# Patient Record
Sex: Male | Born: 1955 | Race: White | Hispanic: No | Marital: Married | Smoking: Current every day smoker
Health system: Southern US, Community
[De-identification: ages and names within clinical notes are randomized; demographics above are authoritative.]

## PROBLEM LIST (undated history)

## (undated) DIAGNOSIS — I10 Essential (primary) hypertension: Secondary | ICD-10-CM

## (undated) DIAGNOSIS — C159 Malignant neoplasm of esophagus, unspecified: Secondary | ICD-10-CM

## (undated) DIAGNOSIS — E039 Hypothyroidism, unspecified: Secondary | ICD-10-CM

## (undated) DIAGNOSIS — G4730 Sleep apnea, unspecified: Secondary | ICD-10-CM

## (undated) DIAGNOSIS — R0981 Nasal congestion: Secondary | ICD-10-CM

## (undated) DIAGNOSIS — J302 Other seasonal allergic rhinitis: Secondary | ICD-10-CM

## (undated) DIAGNOSIS — Z969 Presence of functional implant, unspecified: Secondary | ICD-10-CM

## (undated) DIAGNOSIS — R05 Cough: Secondary | ICD-10-CM

## (undated) DIAGNOSIS — Z8614 Personal history of Methicillin resistant Staphylococcus aureus infection: Secondary | ICD-10-CM

## (undated) DIAGNOSIS — M1990 Unspecified osteoarthritis, unspecified site: Secondary | ICD-10-CM

## (undated) DIAGNOSIS — K219 Gastro-esophageal reflux disease without esophagitis: Secondary | ICD-10-CM

## (undated) DIAGNOSIS — R7611 Nonspecific reaction to tuberculin skin test without active tuberculosis: Secondary | ICD-10-CM

## (undated) HISTORY — DX: Malignant neoplasm of esophagus, unspecified: C15.9

## (undated) HISTORY — PX: KNEE ARTHROPLASTY: SHX992

## (undated) HISTORY — PX: CATARACT EXTRACTION: SUR2

## (undated) HISTORY — PX: OTHER SURGICAL HISTORY: SHX169

## (undated) HISTORY — PX: FINGER AMPUTATION: SHX636

---

## 2011-03-14 ENCOUNTER — Encounter (HOSPITAL_BASED_OUTPATIENT_CLINIC_OR_DEPARTMENT_OTHER)
Admission: RE | Admit: 2011-03-14 | Discharge: 2011-03-14 | Disposition: A | Discharge status: Home / Self Care | Payer: Worker's Compensation | Source: Ambulatory Visit | Attending: Orthopedic Surgery | Admitting: Orthopedic Surgery

## 2011-03-14 DIAGNOSIS — Z01812 Encounter for preprocedural laboratory examination: Secondary | ICD-10-CM | POA: Insufficient documentation

## 2011-03-14 LAB — BASIC METABOLIC PANEL
Calcium: 9 mg/dL (ref 8.4–10.5)
GFR calc Af Amer: >60 mL/min (ref >60)
GFR calc non Af Amer: >60 mL/min (ref >60)
Glucose, Bld: 132 mg/dL — ABNORMAL HIGH (ref 70–99)
Potassium: 3.9 mEq/L (ref 3.5–5.1)
Sodium: 139 mEq/L (ref 135–145)

## 2011-03-18 ENCOUNTER — Ambulatory Visit (HOSPITAL_BASED_OUTPATIENT_CLINIC_OR_DEPARTMENT_OTHER)
Admission: RE | Admit: 2011-03-18 | Discharge: 2011-03-18 | Disposition: A | Discharge status: Home / Self Care | Payer: Worker's Compensation | Source: Ambulatory Visit | Attending: Orthopedic Surgery | Admitting: Orthopedic Surgery

## 2011-03-18 DIAGNOSIS — E039 Hypothyroidism, unspecified: Secondary | ICD-10-CM | POA: Insufficient documentation

## 2011-03-18 DIAGNOSIS — M129 Arthropathy, unspecified: Secondary | ICD-10-CM | POA: Insufficient documentation

## 2011-03-18 DIAGNOSIS — M9380 Other specified osteochondropathies of unspecified site: Secondary | ICD-10-CM | POA: Insufficient documentation

## 2011-03-18 DIAGNOSIS — F17200 Nicotine dependence, unspecified, uncomplicated: Secondary | ICD-10-CM | POA: Insufficient documentation

## 2011-03-18 DIAGNOSIS — Z01812 Encounter for preprocedural laboratory examination: Secondary | ICD-10-CM | POA: Insufficient documentation

## 2011-03-18 DIAGNOSIS — I10 Essential (primary) hypertension: Secondary | ICD-10-CM | POA: Insufficient documentation

## 2011-03-18 HISTORY — PX: WRIST FUSION: SHX839

## 2011-03-18 LAB — POCT HEMOGLOBIN-HEMACUE: Hemoglobin: 16 g/dL (ref 13.0–17.0)

## 2011-03-21 NOTE — Op Note (Signed)
NAMEODA, LANSDOWNE               ACCOUNT NO.:  [id number]  MEDICAL RECORD NO.:  [id number]           PATIENT TYPE:  LOCATION:                                 FACILITY:  PHYSICIAN:  Betha Loa, MD             DATE OF BIRTH:  DATE OF PROCEDURE:  03/18/2011 DATE OF DISCHARGE:                              OPERATIVE REPORT   PREOPERATIVE DIAGNOSIS:  Left wrist Kienbock disease.  POSTOPERATIVE DIAGNOSIS:  Left wrist Kienbock disease.  PROCEDURE:  Left wrist scaphotrapeziotrapezoid fusion.  SURGEON:  Betha Loa, MD  ASSISTANT:  Cindee Salt, MD  ANESTHESIA:  General with regional.  IV FLUIDS:  Per Anesthesia flow sheet.  ESTIMATED BLOOD LOSS:  Minimal.  COMPLICATIONS:  None.  SPECIMENS:  None.  TOURNIQUET TIME:  130 minutes.  DISPOSITION:  Stable to PACU.  INDICATIONS:  Mr. Lamantia is a 55 year old white male who was at work when a door closed on his left wrist.  He had pain in the wrist and was evaluated at an outside hospital and felt to have a lunate fracture.  He was referred to me for further care.  On evaluation, he had intact sensation and capillary refill in the fingertips.  He had a hematoma on the dorsum of the hand.  He had tenderness to palpation at the wrist. Review of x-rays, CT, and MRI revealed that this was likely a Kienbock disease exacerbated by his injury.  I discussed with Mr. Hodapp the nature of this condition.  I recommended a STT fusion with possible radial styloidectomy for treatment of the lunate collapse.  Risks, benefits, and alternative of surgery were discussed including risk of blood loss, infection, damage to nerves, vessels, tendons, ligaments, and bone, failure of surgery, need for additional surgery, complications with wound healing, continued pain, nonunion, malunion, stiffness, potential need for further surgeries in future including possible proximal row carpectomy versus total wrist fusion.  He voiced understanding these risks and  elected to proceed.  OPERATIVE COURSE:  After being identified preoperatively by myself, the patient agreed upon procedure and site of procedure.  The surgical site was marked.  Risks, benefits, and alternative of surgery were reviewed and he wished to proceed.  Surgical consent had been signed.  He was given 1 g of IV Ancef as preoperative antibiotic prophylaxis.  Her was transported to the operating room and placed on the operating table in supine position with the left upper extremity on arm board.  General anesthesia was induced by the anesthesiologist.  A regional block had been performed by the anesthesiologist in preoperative holding.  The left upper extremity was prepped and draped in the normal sterile orthopedic fashion.  A surgical pause was performed between surgeons, Anesthesia, operating room staff and all were in agreement as to the patient, procedure, and site of procedure.  Tourniquet on the proximal aspect of the extremity was inflated to 250 mmHg after exsanguination of the limb with an Esmarch bandage.  An incision was made at the volar radial surface of the wrist.  The FCR was identified and sheath incised. It was retracted  ulnarly.  The radial artery and superficial palmar branch were identified and protected throughout the case.  They were retracted radially.  The radioscaphoid articulation and STT joint were identified.  The radioscaphocapitate ligament was identified and protected throughout the case.  The STT joint was entered.  There were some degenerative changes.  The distal pole of scaphoid the proximal trapezium and trapezoid were cleared of cartilage using the rongeurs and curettes.  A bur was then used to break through the subchondral bone. An incision was made in the pronator quadratus after retracting the FCR and the FPL tendons ulnarly.  The volar cortex of the distal radius was breached using a 0.045-inch K-wires drilled in a circle and an osteotome.   Bone graft was taken using the curettes.  The wounds were all copiously irrigated with sterile saline.  The bone graft was placed in the STT joint.  The guidewires for the Mini Acutrak screws were then placed, one going from the distal pole of the scaphoid into the trapezium and one  going from distal pole of the scaphoid into the trapezoid.  This was checked under C-arm guidance and position was felt to be appropriate.  The opening reamer was then used and the screws placed after standard measuring technique.  Good compression was obtained.  An 18-mm and 22-mm screw were used.  The remainder of the bone graft was packed into the STT joint.  A 3-0 Vicryl was then used to close the capsule over top of the STT joint.  The pronator quadratus was then closed using the Vicryl as well.  A single inverted interrupted stitch was placed in the skin and the skin was closed with 4-0 nylon in a horizontal mattress fashion.  The wound was dressed with sterile Xeroform and 4x4s and wrapped with Kerlix bandage.  A volar splint and thumb spica splint were placed and wrapped with Kerlix and Ace bandage. The tourniquet was deflated at 130 minutes.  The fingertips were pink with brisk capillary refill after deflation of the tourniquet. Operative drapes were broken down.  The patient was awoken from anesthesia safely.  He was transferred back to stretcher and taken to PACU in stable condition.  I will see him back in 1 week for postoperative followup.  I will give him Norco 7.5/325 one to two p.o. q.6 h p.r.n. pain, dispensed #50.     Betha Loa, MD     KK/MEDQ  D:  03/18/2011  T:  03/19/2011  Job:  [id number]  Electronically Signed by Betha Loa  on 03/21/2011 02:35:23 PM

## 2012-03-01 ENCOUNTER — Other Ambulatory Visit: Payer: Self-pay | Admitting: Orthopedic Surgery

## 2012-03-22 DIAGNOSIS — Z969 Presence of functional implant, unspecified: Secondary | ICD-10-CM

## 2012-03-22 HISTORY — DX: Presence of functional implant, unspecified: Z96.9

## 2012-03-25 ENCOUNTER — Encounter (HOSPITAL_BASED_OUTPATIENT_CLINIC_OR_DEPARTMENT_OTHER): Payer: Self-pay | Admitting: *Deleted

## 2012-03-25 DIAGNOSIS — R059 Cough, unspecified: Secondary | ICD-10-CM

## 2012-03-25 DIAGNOSIS — R0981 Nasal congestion: Secondary | ICD-10-CM

## 2012-03-25 HISTORY — DX: Cough, unspecified: R05.9

## 2012-03-25 HISTORY — DX: Nasal congestion: R09.81

## 2012-03-25 NOTE — Pre-Procedure Instructions (Signed)
Last EKG req. from [facility]

## 2012-03-25 NOTE — Pre-Procedure Instructions (Signed)
EKG from 01/2012 received

## 2012-03-26 ENCOUNTER — Encounter (HOSPITAL_BASED_OUTPATIENT_CLINIC_OR_DEPARTMENT_OTHER)
Admission: RE | Admit: 2012-03-26 | Discharge: 2012-03-26 | Disposition: A | Discharge status: Home / Self Care | Payer: Worker's Compensation | Source: Ambulatory Visit | Attending: Orthopedic Surgery | Admitting: Orthopedic Surgery

## 2012-03-26 LAB — BASIC METABOLIC PANEL
BUN: 11 mg/dL (ref 6–23)
Chloride: 105 mEq/L (ref 96–112)
Creatinine, Ser: 0.93 mg/dL (ref 0.50–1.35)
GFR calc Af Amer: >90 mL/min (ref >90)
GFR calc non Af Amer: >90 mL/min (ref >90)
Potassium: 5 mEq/L (ref 3.5–5.1)

## 2012-03-30 ENCOUNTER — Encounter (HOSPITAL_BASED_OUTPATIENT_CLINIC_OR_DEPARTMENT_OTHER): Payer: Self-pay | Admitting: Orthopedic Surgery

## 2012-03-30 ENCOUNTER — Encounter (HOSPITAL_BASED_OUTPATIENT_CLINIC_OR_DEPARTMENT_OTHER): Payer: Self-pay | Admitting: Anesthesiology

## 2012-03-30 ENCOUNTER — Ambulatory Visit (HOSPITAL_BASED_OUTPATIENT_CLINIC_OR_DEPARTMENT_OTHER)
Admission: RE | Admit: 2012-03-30 | Discharge: 2012-03-30 | Disposition: A | Discharge status: Home / Self Care | Payer: Worker's Compensation | Source: Ambulatory Visit | Attending: Orthopedic Surgery | Admitting: Orthopedic Surgery

## 2012-03-30 ENCOUNTER — Encounter (HOSPITAL_BASED_OUTPATIENT_CLINIC_OR_DEPARTMENT_OTHER)
Admission: RE | Disposition: A | Discharge status: Home / Self Care | Payer: Self-pay | Source: Ambulatory Visit | Attending: Orthopedic Surgery

## 2012-03-30 ENCOUNTER — Ambulatory Visit (HOSPITAL_BASED_OUTPATIENT_CLINIC_OR_DEPARTMENT_OTHER): Payer: Worker's Compensation | Admitting: Anesthesiology

## 2012-03-30 ENCOUNTER — Encounter (HOSPITAL_BASED_OUTPATIENT_CLINIC_OR_DEPARTMENT_OTHER): Payer: Self-pay | Admitting: *Deleted

## 2012-03-30 DIAGNOSIS — Z01812 Encounter for preprocedural laboratory examination: Secondary | ICD-10-CM | POA: Insufficient documentation

## 2012-03-30 DIAGNOSIS — M65839 Other synovitis and tenosynovitis, unspecified forearm: Secondary | ICD-10-CM | POA: Insufficient documentation

## 2012-03-30 DIAGNOSIS — IMO0002 Reserved for concepts with insufficient information to code with codable children: Secondary | ICD-10-CM | POA: Insufficient documentation

## 2012-03-30 DIAGNOSIS — I10 Essential (primary) hypertension: Secondary | ICD-10-CM | POA: Insufficient documentation

## 2012-03-30 HISTORY — DX: Cough: R05

## 2012-03-30 HISTORY — DX: Nasal congestion: R09.81

## 2012-03-30 HISTORY — DX: Presence of functional implant, unspecified: Z96.9

## 2012-03-30 HISTORY — DX: Hypothyroidism, unspecified: E03.9

## 2012-03-30 HISTORY — PX: HARDWARE REMOVAL: SHX979

## 2012-03-30 HISTORY — DX: Personal history of Methicillin resistant Staphylococcus aureus infection: Z86.14

## 2012-03-30 HISTORY — DX: Essential (primary) hypertension: I10

## 2012-03-30 HISTORY — DX: Unspecified osteoarthritis, unspecified site: M19.90

## 2012-03-30 HISTORY — DX: Other seasonal allergic rhinitis: J30.2

## 2012-03-30 SURGERY — REMOVAL, HARDWARE
Anesthesia: General | Site: Wrist | Laterality: Left | Wound class: Clean

## 2012-03-30 MED ORDER — MORPHINE SULFATE 4 MG/ML IJ SOLN: 0.0500 mg/kg | INTRAMUSCULAR | Status: DC | PRN

## 2012-03-30 MED ORDER — EPHEDRINE SULFATE 50 MG/ML IJ SOLN
INTRAMUSCULAR | Status: DC | PRN
  Administered 2012-03-30 (×2): 15 mg via INTRAVENOUS
  Administered 2012-03-30: 10 mg via INTRAVENOUS

## 2012-03-30 MED ORDER — LACTATED RINGERS IV SOLN: INTRAVENOUS | Status: DC

## 2012-03-30 MED ORDER — 0.9 % SODIUM CHLORIDE (POUR BTL) OPTIME
TOPICAL | Status: DC | PRN
  Administered 2012-03-30: 100 mL

## 2012-03-30 MED ORDER — LIDOCAINE HCL (CARDIAC) 20 MG/ML IV SOLN
INTRAVENOUS | Status: DC | PRN
  Administered 2012-03-30: 100 mg via INTRAVENOUS

## 2012-03-30 MED ORDER — LACTATED RINGERS IV SOLN
INTRAVENOUS | Status: DC
  Administered 2012-03-30 (×3): via INTRAVENOUS

## 2012-03-30 MED ORDER — CEFAZOLIN SODIUM-DEXTROSE 2-3 GM-% IV SOLR
2.0000 g | INTRAVENOUS | Status: AC
  Administered 2012-03-30: 2 g via INTRAVENOUS

## 2012-03-30 MED ORDER — ROPIVACAINE HCL 5 MG/ML IJ SOLN
INTRAMUSCULAR | Status: DC | PRN
  Administered 2012-03-30: 30 mL via EPIDURAL

## 2012-03-30 MED ORDER — HYDROCODONE-ACETAMINOPHEN 5-325 MG PO TABS: ORAL_TABLET | ORAL | Status: DC

## 2012-03-30 MED ORDER — METOCLOPRAMIDE HCL 5 MG/ML IJ SOLN: 10.0000 mg | Freq: Once | INTRAMUSCULAR | Status: DC | PRN

## 2012-03-30 MED ORDER — MIDAZOLAM HCL 2 MG/2ML IJ SOLN
0.5000 mg | INTRAMUSCULAR | Status: DC | PRN
  Administered 2012-03-30: 2 mg via INTRAVENOUS

## 2012-03-30 MED ORDER — ONDANSETRON HCL 4 MG/2ML IJ SOLN
INTRAMUSCULAR | Status: DC | PRN
  Administered 2012-03-30: 4 mg via INTRAVENOUS

## 2012-03-30 MED ORDER — CEFAZOLIN SODIUM 1-5 GM-% IV SOLN: 1.0000 g | INTRAVENOUS | Status: DC

## 2012-03-30 MED ORDER — PROPOFOL 10 MG/ML IV EMUL
INTRAVENOUS | Status: DC | PRN
  Administered 2012-03-30: 300 mg via INTRAVENOUS

## 2012-03-30 MED ORDER — FENTANYL CITRATE 0.05 MG/ML IJ SOLN: 25.0000 ug | INTRAMUSCULAR | Status: DC | PRN

## 2012-03-30 MED ORDER — DEXAMETHASONE SODIUM PHOSPHATE 4 MG/ML IJ SOLN
INTRAMUSCULAR | Status: DC | PRN
  Administered 2012-03-30: 4 mg via INTRAVENOUS

## 2012-03-30 MED ORDER — CHLORHEXIDINE GLUCONATE 4 % EX LIQD: 60.0000 mL | Freq: Once | CUTANEOUS | Status: DC

## 2012-03-30 MED ORDER — LIDOCAINE HCL 1 % IJ SOLN
INTRAMUSCULAR | Status: DC | PRN
  Administered 2012-03-30: 2 mL via INTRADERMAL

## 2012-03-30 MED ORDER — FENTANYL CITRATE 0.05 MG/ML IJ SOLN
50.0000 ug | INTRAMUSCULAR | Status: DC | PRN
  Administered 2012-03-30: 100 ug via INTRAVENOUS

## 2012-03-30 SURGICAL SUPPLY — 55 items
BANDAGE ELASTIC 3 VELCRO ST LF (GAUZE/BANDAGES/DRESSINGS) ×2 IMPLANT
BANDAGE GAUZE ELAST BULKY 4 IN (GAUZE/BANDAGES/DRESSINGS) ×2 IMPLANT
BLADE MINI RND TIP GREEN BEAV (BLADE) IMPLANT
BLADE SURG 15 STRL LF DISP TIS (BLADE) ×2 IMPLANT
BLADE SURG 15 STRL SS (BLADE) ×2
BLADE SURG ROTATE 9660 (MISCELLANEOUS) ×2 IMPLANT
BNDG COHESIVE 1X5 TAN STRL LF (GAUZE/BANDAGES/DRESSINGS) IMPLANT
BNDG ELASTIC 2 VLCR STRL LF (GAUZE/BANDAGES/DRESSINGS) IMPLANT
BNDG ESMARK 4X9 LF (GAUZE/BANDAGES/DRESSINGS) ×2 IMPLANT
CHLORAPREP W/TINT 26ML (MISCELLANEOUS) ×4 IMPLANT
CLOTH BEACON ORANGE TIMEOUT ST (SAFETY) ×2 IMPLANT
CORDS BIPOLAR (ELECTRODE) ×2 IMPLANT
COVER MAYO STAND STRL (DRAPES) ×2 IMPLANT
COVER TABLE BACK 60X90 (DRAPES) ×2 IMPLANT
CUFF TOURNIQUET SINGLE 18IN (TOURNIQUET CUFF) IMPLANT
CUFF TOURNIQUET SINGLE 24IN (TOURNIQUET CUFF) ×2 IMPLANT
DRAPE EXTREMITY T 121X128X90 (DRAPE) ×2 IMPLANT
DRAPE OEC MINIVIEW 54X84 (DRAPES) ×2 IMPLANT
DRAPE SURG 17X23 STRL (DRAPES) ×2 IMPLANT
DRSG PAD ABDOMINAL 8X10 ST (GAUZE/BANDAGES/DRESSINGS) IMPLANT
GAUZE XEROFORM 1X8 LF (GAUZE/BANDAGES/DRESSINGS) ×2 IMPLANT
GLOVE BIO SURGEON STRL SZ7 (GLOVE) ×2 IMPLANT
GLOVE BIO SURGEON STRL SZ7.5 (GLOVE) ×2 IMPLANT
GLOVE BIOGEL PI IND STRL 7.0 (GLOVE) ×1 IMPLANT
GLOVE BIOGEL PI IND STRL 8.5 (GLOVE) ×1 IMPLANT
GLOVE BIOGEL PI INDICATOR 7.0 (GLOVE) ×1
GLOVE BIOGEL PI INDICATOR 8.5 (GLOVE) ×1
GLOVE INDICATOR 8.0 STRL GRN (GLOVE) ×2 IMPLANT
GLOVE SURG ORTHO 8.0 STRL STRW (GLOVE) ×2 IMPLANT
GOWN PREVENTION PLUS XLARGE (GOWN DISPOSABLE) ×2 IMPLANT
GOWN STRL REIN XL XLG (GOWN DISPOSABLE) ×2 IMPLANT
KWIRE 4.0 X .035IN (WIRE) ×2 IMPLANT
NEEDLE HYPO 25X1 1.5 SAFETY (NEEDLE) IMPLANT
NS IRRIG 1000ML POUR BTL (IV SOLUTION) ×2 IMPLANT
PACK BASIN DAY SURGERY FS (CUSTOM PROCEDURE TRAY) ×2 IMPLANT
PAD CAST 3X4 CTTN HI CHSV (CAST SUPPLIES) ×1 IMPLANT
PADDING CAST ABS 4INX4YD NS (CAST SUPPLIES) ×1
PADDING CAST ABS COTTON 4X4 ST (CAST SUPPLIES) ×1 IMPLANT
PADDING CAST COTTON 3X4 STRL (CAST SUPPLIES) ×1
SLEEVE SCD COMPRESS KNEE MED (MISCELLANEOUS) ×2 IMPLANT
SPLINT PLASTER CAST XFAST 3X15 (CAST SUPPLIES) IMPLANT
SPLINT PLASTER CAST XFAST 4X15 (CAST SUPPLIES) ×10 IMPLANT
SPLINT PLASTER XTRA FAST SET 4 (CAST SUPPLIES) ×10
SPLINT PLASTER XTRA FASTSET 3X (CAST SUPPLIES)
SPONGE GAUZE 4X4 12PLY (GAUZE/BANDAGES/DRESSINGS) ×2 IMPLANT
STOCKINETTE 4X48 STRL (DRAPES) ×2 IMPLANT
STRIP CLOSURE SKIN 1/4X4 (GAUZE/BANDAGES/DRESSINGS) IMPLANT
SUT ETHILON 4 0 PS 2 18 (SUTURE) ×2 IMPLANT
SUT MNCRL AB 4-0 PS2 18 (SUTURE) IMPLANT
SUT VIC AB 3-0 FS2 27 (SUTURE) ×2 IMPLANT
SYR BULB 3OZ (MISCELLANEOUS) ×2 IMPLANT
SYR CONTROL 10ML LL (SYRINGE) IMPLANT
TOWEL OR 17X24 6PK STRL BLUE (TOWEL DISPOSABLE) ×4 IMPLANT
UNDERPAD 30X30 INCONTINENT (UNDERPADS AND DIAPERS) ×2 IMPLANT
WATER STERILE IRR 1000ML POUR (IV SOLUTION) ×2 IMPLANT

## 2012-03-30 NOTE — Brief Op Note (Signed)
03/30/2012  11:08 AM  PATIENT:  Robert Weiss  56 y.o. male  PRE-OPERATIVE DIAGNOSIS:  Left wrist retained hardware  POST-OPERATIVE DIAGNOSIS:  Left wrist retained hardware  PROCEDURE:  Procedure(s) (LRB): HARDWARE REMOVAL (Left)  SURGEON:  Surgeon(s) and Role:    * Tami Ribas, MD - Primary    * Nicki Reaper, MD - Assisting  PHYSICIAN ASSISTANT:   ASSISTANTS: none   ANESTHESIA:   regional and general  EBL:  Total I/O In: 1200 [I.V.:1200] Out: -   BLOOD ADMINISTERED:none  DRAINS: none   LOCAL MEDICATIONS USED:  NONE  SPECIMEN:  No Specimen  DISPOSITION OF SPECIMEN:  N/A  COUNTS:  YES  TOURNIQUET:   Total Tourniquet Time Documented: Upper Arm (Left) - 31 minutes  DICTATION: .Other Dictation: Dictation Number [id number]  PLAN OF CARE: Discharge to home after PACU  PATIENT DISPOSITION:  PACU - hemodynamically stable.

## 2012-03-30 NOTE — Anesthesia Preprocedure Evaluation (Addendum)
Anesthesia Evaluation  Patient identified by MRN, date of birth, ID band Patient awake    Reviewed: Allergy & Precautions, H&P , NPO status , Patient's Chart, lab work & pertinent test results, reviewed documented beta blocker date and time   Airway Mallampati: III TM Distance: >3 FB Neck ROM: full    Dental  (+) Dental Advisory Given   Pulmonary neg pulmonary ROS,          Cardiovascular hypertension, On Medications     Neuro/Psych negative neurological ROS  negative psych ROS   GI/Hepatic negative GI ROS, Neg liver ROS,   Endo/Other  negative endocrine ROSMorbid obesity  Renal/GU negative Renal ROS  negative genitourinary   Musculoskeletal   Abdominal   Peds  Hematology negative hematology ROS (+)   Anesthesia Other Findings See surgeon's H&P   Reproductive/Obstetrics negative OB ROS                          Anesthesia Physical Anesthesia Plan  ASA: III  Anesthesia Plan: General   Post-op Pain Management:    Induction: Intravenous  Airway Management Planned: LMA  Additional Equipment:   Intra-op Plan:   Post-operative Plan: Extubation in OR  Informed Consent: I have reviewed the patients History and Physical, chart, labs and discussed the procedure including the risks, benefits and alternatives for the proposed anesthesia with the patient or authorized representative who has indicated his/her understanding and acceptance.     Plan Discussed with: CRNA and Surgeon  Anesthesia Plan Comments:         Anesthesia Quick Evaluation

## 2012-03-30 NOTE — Anesthesia Postprocedure Evaluation (Signed)
Anesthesia Post Note  Patient: Robert Weiss  Procedure(s) Performed: Procedure(s) (LRB): HARDWARE REMOVAL (Left)  Anesthesia type: General  Patient location: PACU  Post pain: Pain level controlled  Post assessment: Patient's Cardiovascular Status Stable  Last Vitals:  Filed Vitals:   03/30/12 1200  BP: 112/53  Pulse: 77  Temp:   Resp: 21    Post vital signs: Reviewed and stable  Level of consciousness: alert  Complications: No apparent anesthesia complications

## 2012-03-30 NOTE — Op Note (Signed)
Dictation [id number]

## 2012-03-30 NOTE — H&P (Signed)
Boone Franssen is an 56 y.o. male.   Chief Complaint: painful hardware HPI: 56 yo male one year s/p left wrist STT fusion for Kienbock's.  Pain at radial side of wrist in area of hardware.  Would like hardware removed to try to relieve discomfort.  Past Medical History  Diagnosis Date  . Seasonal allergies   . Arthritis     shoulders, hands, knees, back  . Hypothyroidism   . Hx MRSA infection 10 yrs. ago    right and left legs  . Hypertension     under control, has been on med. x 7-8 yrs.  . Retained orthopedic hardware 03/2012    left wrist  . Cough 03/25/2012  . Nasal congestion 03/25/2012    Past Surgical History  Procedure Date  . 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 Wrist fusion 03/18/2011    left wrist STT fusion    History reviewed. No pertinent family history. Social History:  reports that he has been smoking Cigarettes.  He has a 30 pack-year smoking history. He has never used smokeless tobacco. He reports that he does not drink alcohol or use illicit drugs.  Allergies:  Allergies  Allergen Reactions  . Percocet (Oxycodone-Acetaminophen) Nausea And Vomiting    Medications Prior to Admission  Medication Dose Route Frequency Provider Last Rate Last Dose  . ceFAZolin (ANCEF) IVPB 1 g/50 mL premix  1 g Intravenous 60 min Pre-Op Tami Ribas, MD      . chlorhexidine (HIBICLENS) 4 % liquid 4 application  60 mL Topical Once Tami Ribas, MD      . fentaNYL (SUBLIMAZE) injection 50-100 mcg  50-100 mcg Intravenous PRN Hart Robinsons, MD      . midazolam (VERSED) injection 0.5-2 mg  0.5-2 mg Intravenous PRN Hart Robinsons, MD       Medications Prior to Admission  Medication Sig Dispense Refill  . aspirin 81 MG tablet Take 81 mg by mouth daily.      . enalapril (VASOTEC) 10 MG tablet Take 10 mg by mouth daily. AM      . etodolac (LODINE) 500 MG tablet Take 500 mg by mouth daily. AM      . fish oil-omega-3 fatty acids 1000 MG capsule Take 2 g by mouth daily.       Marland Kitchen glucosamine-chondroitin 500-400 MG tablet Take 1 tablet by mouth 3 (three) times daily.      Marland Kitchen levothyroxine (SYNTHROID, LEVOTHROID) 50 MCG tablet Take 50 mcg by mouth daily. AM        No results found for this or any previous visit (from the past 48 hour(s)).  No results found.   A comprehensive review of systems was negative.  Blood pressure 146/84, pulse 72, temperature 98.3 F (36.8 C), temperature source Oral, resp. rate 20, height 5\' 10"  (1.778 m), weight 147.419 kg (325 lb), SpO2 94.00%.  General appearance: alert, cooperative and appears stated age Head: Normocephalic, without obvious abnormality, atraumatic Neck: supple, symmetrical, trachea midline Resp: clear to auscultation bilaterally Cardio: regular rate and rhythm GI: soft, non-tender; bowel sounds normal; no masses,  no organomegaly Extremities: light touch sensation and capillary refill intact all digits.  +epl/fpl/io.  ttp radial side of left volar wrist.  no wounds. Pulses: 2+ and symmetric Skin: Skin color, texture, turgor normal. No rashes or lesions Neurologic: Grossly normal Incision/Wound: na  Assessment/Plan Left wrist retained hardware.  Discussed options with patient.  He wishes to have hardware removed to  try to relieve left wrist discomfort.  Risks, benefits, and alternatives of surgery were discussed and the patient agrees with the plan of care.   Rilei Kravitz R 03/30/2012, 9:22 AM

## 2012-03-30 NOTE — Transfer of Care (Signed)
Immediate Anesthesia Transfer of Care Note  Patient: Robert Weiss  Procedure(s) Performed: Procedure(s) (LRB): HARDWARE REMOVAL (Left)  Patient Location: PACU  Anesthesia Type: General and Regional  Level of Consciousness: awake, alert  and oriented  Airway & Oxygen Therapy: Patient Spontanous Breathing and Patient connected to face mask oxygen  Post-op Assessment: Report given to PACU RN and Post -op Vital signs reviewed and stable  Post vital signs: Reviewed and stable  Complications: No apparent anesthesia complications

## 2012-03-30 NOTE — Op Note (Signed)
NAMEKERMITT, HARJO NO.:  [id number]  MEDICAL RECORD NO.:  [id number]  LOCATION:                                 FACILITY:  PHYSICIAN:  Betha Loa, MD             DATE OF BIRTH:  DATE OF PROCEDURE:  03/30/2012 DATE OF DISCHARGE:                              OPERATIVE REPORT   PREOPERATIVE DIAGNOSES:  Left painful retained hardware and flexor carpi radialis tendinitis.  POSTOPERATIVE DIAGNOSES:  Left painful retained hardware and flexor carpi radialis tendinitis.  PROCEDURE:  Left wrist removal of Acutrak screws x2 and lysis of left FCR tendon.  SURGEON:  Betha Loa, MD  ASSISTANT:  Cindee Salt, MD  ANESTHESIA:  General with regional.  IV FLUIDS:  Per anesthesia flow sheet.  ESTIMATED BLOOD LOSS:  Minimal.  COMPLICATIONS:  None.  SPECIMENS:  None.  TOURNIQUET TIME:  31 minutes.  DISPOSITION:  Stable to PACU.  INDICATIONS:  Mr. Stefanski is a 56 year old male who 1 year ago underwent STT fusion for Kienbock disease exacerbated by an acute injury.  He has partially healed his fusion.  He continued to have some pain in the wrist.  On localization x-rays, it was right over the one of the screws. It was felt that removal of the screw made relieve some of his pain.  It was also felt that he may have some FCR tendinitis from irritation from screw.  Risks, benefits, and alternatives of surgery were discussed including the risk of blood loss, infection, damage to nerves, vessels, tendons, ligaments, bone, failure of surgery, need for additional surgery, complications with wound healing, continued pain, loss of fusion.  He voiced understanding of these risks and elected to proceed.  OPERATIVE COURSE:  After being identified preoperatively by myself, the patient and I agreed upon the procedure and site of procedure.  The surgical site was marked.  Risks, benefits, and alternatives of surgery were reviewed and he wished to proceed.  Surgical consent had  been signed.  He was given 1 g of IV Ancef as preoperative antibiotic prophylaxis.  Regional block was performed by Anesthesia in preoperative holding.  He was transferred to the operating room and placed on the operating room table in supine position with the left upper extremity on an armboard.  General anesthesia was induced by the anesthesiologist.  The right upper extremity was prepped and draped in normal sterile orthopedic fashion.  A surgical pause was performed between the surgeons, anesthesia, and operating room staff and all were in agreement as to the patient procedure and site of procedure.  Tourniquet at the proximal aspect of the extremity was inflated to 250 mmHg after exsanguination of the limb with an Esmarch bandage.  The previous incision was followed.  This was carried into subcutaneous tissues by spreading technique.  The FCR tendon was identified.  There was significant scar surrounding it.  The superficial scar was incised and freed from the tendon.  The tendon was freed of its scar tissue both proximally and distally until there was no apparent scar tissue around it.  The tendon was retracted.  An incision was made in the capsule. The  head of the more radial Acutrak screw was identified.  The more ulnar screw was slightly buried beneath the bone.  It was freed up by using a curette.  A guidewire was placed.  Both screws were removed using the Acutrak screwdriver.  C-arm was used in AP and lateral projections throughout the case to ensure appropriate positioning and complete removal of hardware.  The wound was copiously irrigated with sterile saline.  A single Vicryl suture was placed in an inverted interrupted fashion in the subcutaneous tissues.  The skin was closed with 4-0 nylon in a horizontal mattress fashion.  The wound was dressed with sterile Xeroform, 4x4s, and wrapped with Kerlix.  A volar splint was placed and wrapped with Kerlix and Ace bandage.  The  operative drapes were broken down and the tourniquet deflated.  Fingertips were pink with brisk capillary refill after deflation of the tourniquet.  The patient was transferred back to stretcher and taken to PACU stable condition.  I will see him back in the office in 1 week for postoperative followup.  I will give him Norco 5/325, 1-2 p.o. q.6 hours p.r.n. pain, dispensed #40.     Betha Loa, MD     KK/MEDQ  D:  03/30/2012  T:  03/30/2012  Job:  [id number]

## 2012-03-30 NOTE — Anesthesia Procedure Notes (Addendum)
Anesthesia Regional Block:  Supraclavicular block  Pre-Anesthetic Checklist: ,, timeout performed, Correct Patient, Correct Site, Correct Laterality, Correct Procedure, Correct Position, site marked, Risks and benefits discussed,  Surgical consent,  Pre-op evaluation,  At surgeon's request and post-op pain management  Laterality: Left  Prep: chloraprep       Needles:   Needle Type: Other   (Arrow Echogenic)   Needle Length: 9cm  Needle Gauge: 21    Additional Needles:  Procedures: ultrasound guided Supraclavicular block Narrative:  Start time: 03/30/2012 9:45 AM End time: 03/30/2012 9:53 AM Injection made incrementally with aspirations every 5 mL.  Performed by: Personally  Anesthesiologist: C Frederick  Additional Notes: Ultrasound guidance used to: id relevant anatomy, confirm needle position, local anesthetic spread, avoidance of vascular puncture. Picture saved. No complications. Block performed personally by Janetta Hora. Gelene Mink, MD    Supraclavicular block Procedure Name: LMA Insertion Performed by: Fran Lowes Pre-anesthesia Checklist: Patient identified, Emergency Drugs available, Suction available and Patient being monitored Patient Re-evaluated:Patient Re-evaluated prior to inductionOxygen Delivery Method: Circle System Utilized Preoxygenation: Pre-oxygenation with 100% oxygen Intubation Type: IV induction Ventilation: Mask ventilation without difficulty LMA: LMA with gastric port inserted LMA Size: 5.0 Number of attempts: 1 Placement Confirmation: positive ETCO2 Tube secured with: Tape Dental Injury: Teeth and Oropharynx as per pre-operative assessment

## 2012-03-30 NOTE — Discharge Instructions (Addendum)
[facility] Instructions Hand Surgery  Wound Care: Keep your hand elevated above the level of your heart.  Do not allow it to dangle  by your side.  Keep the dressing dry and do not remove it unless your doctor advises you to do so.  He will usually change it at the time of your post-op visit.  Moving your fingers is advised to stimulate circulation but will depend on the site of your surgery.  If you have a splint applied, your doctor will advise you regarding movement.  Activity: Do not drive or operate machinery today.  Rest today and then you may return to your normal activity and work as indicated by your physician.  Diet:  Drink liquids today or eat a light diet.  You may resume a regular diet tomorrow.    General expectations: Pain for two to three days. Fingers may become slightly swollen.  Call your doctor if any of the following occur: Severe pain not relieved by pain medication. Elevated temperature. Dressing soaked with blood. Inability to move fingers. White or bluish color to fingers.    Post Anesthesia Home Care Instructions  Activity: Get plenty of rest for the remainder of the day. A responsible adult should stay with you for 24 hours following the procedure.  For the next 24 hours, DO NOT: -Drive a car -Advertising copywriter -Drink alcoholic beverages -Take any medication unless instructed by your physician -Make any legal decisions or sign important papers.  Meals: Start with liquid foods such as gelatin or soup. Progress to regular foods as tolerated. Avoid greasy, spicy, heavy foods. If nausea and/or vomiting occur, drink only clear liquids until the nausea and/or vomiting subsides. Call your physician if vomiting continues.  Special Instructions/Symptoms: Your throat may feel dry or sore from the anesthesia or the breathing tube placed in your throat during surgery. If this causes discomfort, gargle with warm salt water. The discomfort should disappear within  24 hours.   Post Anesthesia Home Care Instructions  Activity: Get plenty of rest for the remainder of the day. A responsible adult should stay with you for 24 hours following the procedure.  For the next 24 hours, DO NOT: -Drive a car -Advertising copywriter -Drink alcoholic beverages -Take any medication unless instructed by your physician -Make any legal decisions or sign important papers.  Meals: Start with liquid foods such as gelatin or soup. Progress to regular foods as tolerated. Avoid greasy, spicy, heavy foods. If nausea and/or vomiting occur, drink only clear liquids until the nausea and/or vomiting subsides. Call your physician if vomiting continues.  Special Instructions/Symptoms: Your throat may feel dry or sore from the anesthesia or the breathing tube placed in your throat during surgery. If this causes discomfort, gargle with warm salt water. The discomfort should disappear within 24 hours.  Regional Anesthesia Blocks  1. Numbness or the inability to move the "blocked" extremity may last from 3-48 hours after placement. The length of time depends on the medication injected and your individual response to the medication. If the numbness is not going away after 48 hours, call your surgeon.  2. The extremity that is blocked will need to be protected until the numbness is gone and the  Strength has returned. Because you cannot feel it, you will need to take extra care to avoid injury. Because it may be weak, you may have difficulty moving it or using it. You may not know what position it is in without looking at it while the  block is in effect.  3. For blocks in the legs and feet, returning to weight bearing and walking needs to be done carefully. You will need to wait until the numbness is entirely gone and the strength has returned. You should be able to move your leg and foot normally before you try and bear weight or walk. You will need someone to be with you when you first try to  ensure you do not fall and possibly risk injury.  4. Bruising and tenderness at the needle site are common side effects and will resolve in a few days.  5. Persistent numbness or new problems with movement should be communicated to the surgeon or the [facility] [phone] [facility] [phone]).

## 2012-03-30 NOTE — Progress Notes (Signed)
Assisted Dr. Frederick with left, ultrasound guided, supraclavicular block. Side rails up, monitors on throughout procedure. See vital signs in flow sheet. Tolerated Procedure well. 

## 2012-03-31 ENCOUNTER — Encounter (HOSPITAL_BASED_OUTPATIENT_CLINIC_OR_DEPARTMENT_OTHER): Payer: Self-pay | Admitting: Orthopedic Surgery

## 2015-03-15 ENCOUNTER — Other Ambulatory Visit (INDEPENDENT_AMBULATORY_CARE_PROVIDER_SITE_OTHER): Payer: Self-pay

## 2015-03-15 DIAGNOSIS — Z01818 Encounter for other preprocedural examination: Secondary | ICD-10-CM

## 2015-04-23 ENCOUNTER — Ambulatory Visit: Payer: Worker's Compensation | Admitting: Dietician

## 2015-04-23 ENCOUNTER — Other Ambulatory Visit: Payer: Self-pay

## 2015-04-23 ENCOUNTER — Encounter: Payer: Medicare Other | Attending: General Surgery | Admitting: Dietician

## 2015-04-23 ENCOUNTER — Ambulatory Visit (HOSPITAL_COMMUNITY)
Admission: RE | Admit: 2015-04-23 | Discharge: 2015-04-23 | Disposition: A | Discharge status: Home / Self Care | Payer: Medicare Other | Source: Ambulatory Visit | Attending: General Surgery | Admitting: General Surgery

## 2015-04-23 ENCOUNTER — Encounter: Payer: Self-pay | Admitting: Dietician

## 2015-04-23 DIAGNOSIS — Z713 Dietary counseling and surveillance: Secondary | ICD-10-CM | POA: Insufficient documentation

## 2015-04-23 DIAGNOSIS — K219 Gastro-esophageal reflux disease without esophagitis: Secondary | ICD-10-CM | POA: Diagnosis not present

## 2015-04-23 DIAGNOSIS — Z01818 Encounter for other preprocedural examination: Secondary | ICD-10-CM | POA: Insufficient documentation

## 2015-04-23 DIAGNOSIS — J9811 Atelectasis: Secondary | ICD-10-CM | POA: Insufficient documentation

## 2015-04-23 DIAGNOSIS — Z6841 Body Mass Index (BMI) 40.0 and over, adult: Secondary | ICD-10-CM | POA: Diagnosis not present

## 2015-04-23 DIAGNOSIS — K224 Dyskinesia of esophagus: Secondary | ICD-10-CM | POA: Diagnosis not present

## 2015-04-23 DIAGNOSIS — Z87891 Personal history of nicotine dependence: Secondary | ICD-10-CM | POA: Insufficient documentation

## 2015-04-23 DIAGNOSIS — I517 Cardiomegaly: Secondary | ICD-10-CM | POA: Insufficient documentation

## 2015-04-23 NOTE — Progress Notes (Signed)
  Pre-Op Assessment Visit:  Pre-Operative Sleeve Gastrectomy Surgery  Medical Nutrition Therapy:  Appt start time: 1115  End time:  1145.  Patient was seen on 04/23/2015 for Pre-Operative Nutrition Assessment. Assessment and [facility] on 04/23/2015.   Preferred Learning Style:   No preference indicated   Learning Readiness:   Ready  Handouts given during visit include:  Pre-Op Goals Bariatric Surgery Protein Shakes   During the appointment today the following Pre-Op Goals were reviewed with the patient: Maintain or lose weight as instructed by your surgeon Make healthy food choices Begin to limit portion sizes Limited concentrated sugars and fried foods Keep fat/sugar in the single digits per serving on   food labels Practice CHEWING your food  (aim for 30 chews per bite or until applesauce consistency) Practice not drinking 15 minutes before, during, and 30 minutes after each meal/snack Avoid all carbonated beverages  Avoid/limit caffeinated beverages  Avoid all sugar-sweetened beverages Consume 3 meals per day; eat every 3-5 hours Make a list of non-food related activities Aim for 64-100 ounces of FLUID daily  Aim for at least 60-80 grams of PROTEIN daily Look for a liquid protein source that contain ?15 g protein and ?5 g carbohydrate  (ex: shakes, drinks, shots)  Patient-Centered Goals: Robert Weiss is hoping to be able to exercise again. 10 level of confidence/ 10 level of importance  Demonstrated degree of understanding via:  Teach Back  Teaching Method Utilized:  Visual Auditory Hands on  Barriers to learning/adherence to lifestyle change: none  Patient to call the [facility] to enroll in Pre-Op and Post-Op Nutrition Education when surgery date is scheduled.

## 2015-04-23 NOTE — Patient Instructions (Signed)

## 2015-05-01 ENCOUNTER — Ambulatory Visit (INDEPENDENT_AMBULATORY_CARE_PROVIDER_SITE_OTHER): Payer: 59 | Admitting: Psychiatry

## 2015-05-15 ENCOUNTER — Ambulatory Visit (INDEPENDENT_AMBULATORY_CARE_PROVIDER_SITE_OTHER): Payer: 59 | Admitting: Psychiatry

## 2015-08-31 ENCOUNTER — Other Ambulatory Visit: Payer: Self-pay | Admitting: General Surgery

## 2015-08-31 NOTE — Progress Notes (Signed)
Please put orders in Epic surgery 09-18-15 pre op 09-13-15 Thanks

## 2015-09-03 ENCOUNTER — Other Ambulatory Visit: Payer: Self-pay | Admitting: General Surgery

## 2015-09-03 ENCOUNTER — Encounter: Payer: Medicare Other | Attending: General Surgery

## 2015-09-03 DIAGNOSIS — Z713 Dietary counseling and surveillance: Secondary | ICD-10-CM | POA: Insufficient documentation

## 2015-09-03 DIAGNOSIS — Z6841 Body Mass Index (BMI) 40.0 and over, adult: Secondary | ICD-10-CM | POA: Diagnosis not present

## 2015-09-05 NOTE — Progress Notes (Signed)
  Pre-Operative Nutrition Class:  Appt start time: 4159   End time:  1830.  Patient was seen on 09/03/15 for Pre-Operative Bariatric Surgery Education at the [facility].   Surgery date: 09/18/15 Surgery type: Sleeve Gastrectomy Start weight at [facility]: 379 lbs on 04/23/15 Weight today: 377.7 lbs  TANITA  BODY COMP RESULTS  09/03/15   BMI (kg/m^2) N/A   Fat Mass (lbs)    Fat Free Mass (lbs)    Total Body Water (lbs)     The following the learning objectives were met by the patient during this course:  Identify Pre-Op Dietary Goals and will begin 2 weeks pre-operatively  Identify appropriate sources of fluids and proteins   State protein recommendations and appropriate sources pre and post-operatively  Identify Post-Operative Dietary Goals and will follow for 2 weeks post-operatively  Identify appropriate multivitamin and calcium sources  Describe the need for physical activity post-operatively and will follow MD recommendations  State when to call healthcare provider regarding medication questions or post-operative complications  Handouts given during class include:  Pre-Op Bariatric Surgery Diet Handout  Protein Shake Handout  Post-Op Bariatric Surgery Nutrition Handout  [facility] Program Information Flyer  Support Group Information Flyer  [facility] Bariatric Supplements Price List  Follow-Up Plan: Patient will follow-up at [facility] 2 weeks post operatively for diet advancement per MD.

## 2015-09-10 NOTE — Progress Notes (Signed)
04/23/15-NOTED IN EPIC EKG AND CXR.

## 2015-09-10 NOTE — Patient Instructions (Addendum)
Eain Ey  09/10/2015   Your procedure is scheduled on: Tuesday 09/18/2015  Report to [facility] Main  Entrance take [facility]  elevators to 3rd floor to  Short Stay Center at  0930 AM.  Call this number if you have problems the morning of surgery [phone]   Remember: ONLY 1 PERSON MAY GO WITH YOU TO SHORT STAY TO GET  READY MORNING OF YOUR SURGERY.  Do not eat food or drink liquids :After Midnight. DO NOT TAKE ANY DIABETIC MEDS  AM OF SURGERY.     Take these medicines the morning of surgery with A SIP OF WATER: LEVOTHYROXINE. Bring Cpap mask/tubing.                               You may not have any metal on your body including hair pins and              piercings  Do not wear jewelry, make-up, lotions, powders or perfumes, deodorant             Do not wear nail polish.  Do not shave  48 hours prior to surgery.              Men may shave face and neck.   Do not bring valuables to the hospital.  IS NOT             RESPONSIBLE   FOR VALUABLES.  Contacts, dentures or bridgework may not be worn into surgery.  Leave suitcase in the car. After surgery it may be brought to your room.     Patients discharged the day of surgery will not be allowed to drive home.  Name and phone number of your driver: Dondra Spry -[phone] cell  Special Instructions: follow any MD office instructions.              Please read over the following fact sheets you were given: below _____________________________________________________________________             [facility] - Preparing for Surgery Before surgery, you can play an important role.  Because skin is not sterile, your skin needs to be as free of germs as possible.  You can reduce the number of germs on your skin by washing with CHG (chlorahexidine gluconate) soap before surgery.  CHG is an antiseptic cleaner which kills germs and bonds with the skin to continue killing germs even after washing. Please DO NOT use  if you have an allergy to CHG or antibacterial soaps.  If your skin becomes reddened/irritated stop using the CHG and inform your nurse when you arrive at Short Stay. Do not shave (including legs and underarms) for at least 48 hours prior to the first CHG shower.  You may shave your face/neck. Please follow these instructions carefully:  1.  Shower with CHG Soap the night before surgery and the  morning of Surgery.  2.  If you choose to wash your hair, wash your hair first as usual with your  normal  shampoo.  3.  After you shampoo, rinse your hair and body thoroughly to remove the  shampoo.                           4.  Use CHG as you would any other liquid soap.  You can apply  chg directly  to the skin and wash                       Gently with a scrungie or clean washcloth.  5.  Apply the CHG Soap to your body ONLY FROM THE NECK DOWN.   Do not use on face/ open                           Wound or open sores. Avoid contact with eyes, ears mouth and genitals (private parts).                       Wash face,  Genitals (private parts) with your normal soap.             6.  Wash thoroughly, paying special attention to the area where your surgery  will be performed.  7.  Thoroughly rinse your body with warm water from the neck down.  8.  DO NOT shower/wash with your normal soap after using and rinsing off  the CHG Soap.                9.  Pat yourself dry with a clean towel.            10.  Wear clean pajamas.            11.  Place clean sheets on your bed the night of your first shower and do not  sleep with pets. Day of Surgery : Do not apply any lotions/deodorants the morning of surgery.  Please wear clean clothes to the hospital/surgery center.  FAILURE TO FOLLOW THESE INSTRUCTIONS MAY RESULT IN THE CANCELLATION OF YOUR SURGERY PATIENT SIGNATURE_________________________________  NURSE  SIGNATURE__________________________________  ________________________________________________________________________

## 2015-09-13 ENCOUNTER — Encounter (HOSPITAL_COMMUNITY): Payer: Self-pay

## 2015-09-13 ENCOUNTER — Encounter (HOSPITAL_COMMUNITY)
Admission: RE | Admit: 2015-09-13 | Discharge: 2015-09-13 | Disposition: A | Discharge status: Home / Self Care | Payer: Medicare Other | Source: Ambulatory Visit | Attending: General Surgery | Admitting: General Surgery

## 2015-09-13 DIAGNOSIS — Z6841 Body Mass Index (BMI) 40.0 and over, adult: Secondary | ICD-10-CM | POA: Diagnosis not present

## 2015-09-13 DIAGNOSIS — Z01812 Encounter for preprocedural laboratory examination: Secondary | ICD-10-CM | POA: Insufficient documentation

## 2015-09-13 HISTORY — DX: Nonspecific reaction to tuberculin skin test without active tuberculosis: R76.11

## 2015-09-13 HISTORY — DX: Sleep apnea, unspecified: G47.30

## 2015-09-13 HISTORY — DX: Gastro-esophageal reflux disease without esophagitis: K21.9

## 2015-09-13 LAB — CBC WITH DIFFERENTIAL/PLATELET
BASOS PCT: 0 %
Basophils Absolute: 0 10*3/uL (ref 0.0–0.1)
EOS ABS: 0.3 10*3/uL (ref 0.0–0.7)
Eosinophils Relative: 2 %
HEMATOCRIT: 44.5 % (ref 39.0–52.0)
Hemoglobin: 15 g/dL (ref 13.0–17.0)
Lymphocytes Relative: 23 %
Lymphs Abs: 2.5 10*3/uL (ref 0.7–4.0)
MCH: 29 pg (ref 26.0–34.0)
MCHC: 33.7 g/dL (ref 30.0–36.0)
MCV: 86.1 fL (ref 78.0–100.0)
MONO ABS: 0.8 10*3/uL (ref 0.1–1.0)
MONOS PCT: 7 %
NEUTROS ABS: 7.4 10*3/uL (ref 1.7–7.7)
Neutrophils Relative %: 68 %
Platelets: 267 10*3/uL (ref 150–400)
RBC: 5.17 MIL/uL (ref 4.22–5.81)
RDW: 14 % (ref 11.5–15.5)
WBC: 11 10*3/uL — ABNORMAL HIGH (ref 4.0–10.5)

## 2015-09-13 LAB — COMPREHENSIVE METABOLIC PANEL
ALBUMIN: 4.7 g/dL (ref 3.5–5.0)
ALT: 33 U/L (ref 17–63)
ANION GAP: 13 (ref 5–15)
AST: 30 U/L (ref 15–41)
Alkaline Phosphatase: 64 U/L (ref 38–126)
BILIRUBIN TOTAL: 0.6 mg/dL (ref 0.3–1.2)
BUN: 21 mg/dL — ABNORMAL HIGH (ref 6–20)
CO2: 23 mmol/L (ref 22–32)
Calcium: 10 mg/dL (ref 8.9–10.3)
Chloride: 102 mmol/L (ref 101–111)
Creatinine, Ser: 1.12 mg/dL (ref 0.61–1.24)
GFR calc Af Amer: >60 mL/min (ref >60)
GFR calc non Af Amer: >60 mL/min (ref >60)
GLUCOSE: 94 mg/dL (ref 65–99)
POTASSIUM: 4.4 mmol/L (ref 3.5–5.1)
Sodium: 138 mmol/L (ref 135–145)
TOTAL PROTEIN: 7.9 g/dL (ref 6.5–8.1)

## 2015-09-13 LAB — SURGICAL PCR SCREEN
MRSA, PCR: NEGATIVE
STAPHYLOCOCCUS AUREUS: NEGATIVE

## 2015-09-13 NOTE — Pre-Procedure Instructions (Signed)
09-13-15 EKG/CXR 04-23-15 Epic. PCR screen-hx. 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 Baribed requested(Gary)-Portable equipment.

## 2015-09-17 NOTE — H&P (Signed)
History of Present Illness Sharlet Salina T. Hoxworth MD; 09/13/2015 10:36 AM) Patient words: sleeve f/u.  The patient is a 59 year old male who presents with obesity. He returns for a preop visit prior to planned laparoscopic sleeve gastrectomy . Patient was referred by Dr Shary Decamp for consideration for surgical treatment for morbid obesity. He has been somewhat overweight all his life but gives a history of progressive obesity over the last several years since he became disabled from an on-the-job injury despite multiple attempts at medical management. He has been able to lose 100 pounds on his own in the past but more recently has much more difficulty getting the weight off. Obesity has been affecting the patient in a number of ways including mainly significant back and knee pain which severely limits his activities and quality of life.. Significant co-morbid illnesses have developed including obstructive sleep apnea, hypertension, osteoarthritis and prediabetes. He has completed his preoperative workup. No concerns on [facility] nutrition evaluation. Upper GI series showed some disordered peristalsis in the esophagus but no hiatal hernia. He is on his preoperative diet and doing well with this. He has been feeling well with no intercurrent illness.   Problem List/Past Medical Mariella Saa, MD; 09/13/2015 10:39 AM) MORBID OBESITY WITH BMI OF 50.0-59.9, ADULT (E66.01)  Other Problems Mariella Saa, MD; 09/13/2015 10:39 AM) Thyroid Disease High blood pressure Sleep Apnea Back Pain Arthritis  Diagnostic Studies History Mariella Saa, MD; 09/13/2015 10:39 AM) Colonoscopy never  Allergies Lamar Laundry Bynum, CMA; 09/13/2015 10:17 AM) No Known Drug Allergies03/24/2016  Medication History Mariella Saa, MD; 09/13/2015 10:39 AM) Enalapril Maleate (  Tablet, Oral) Active. Etodolac (  Tablet, Oral) Active. Levothyroxine Sodium ( Tablet, Oral)  Active. Medications Reconciled OxyCODONE HCl ( /5ML Solution, 5-10 Milliliter Oral every four hours, as needed, Taken starting 09/13/2015) Active. Protonix (  Tablet DR, 1 (one) Tablet DR Oral daily, Taken starting 09/13/2015) Active. Zofran ODT (  Tablet Disperse, 1 (one) Tablet Disperse Oral every four hours, as needed, Taken starting 09/13/2015) Active.  Social History Mariella Saa, MD; 09/13/2015 10:39 AM) Alcohol use Remotely quit alcohol use. Tobacco use Former smoker. Caffeine use Carbonated beverages, Coffee, Tea. Illicit drug use Remotely quit drug use.  Family History Mariella Saa, MD; 09/13/2015 10:39 AM) Heart Disease Brother. Arthritis Brother, Mother. Hypertension Brother. Prostate Cancer Brother. Melanoma Brother.  Vitals (Sonya Bynum CMA; 09/13/2015 10:17 AM) 09/13/2015 10:17 AM Weight: 359 lb Height: 69in Body Surface Area: 2.82 m Body Mass Index: 53.01 kg/m Pulse: 81 (Regular)  BP: 134/73 (Sitting, Left Arm, Standard)    Physical Exam Sharlet Salina T. Hoxworth MD; 09/13/2015 10:39 AM) The physical exam findings are as follows: Note:General: Alert, morbidly obese Caucasian male, in no distress Skin: Warm and dry without rash or infection. HEENT: No palpable masses or thyromegaly. Sclera nonicteric. Pupils equal round and reactive. Oropharynx clear. Lymph nodes: No cervical, supraclavicular, or inguinal nodes palpable. Lungs: Breath sounds clear and equal. No wheezing or increased work of breathing. Cardiovascular: Regular rate and rhythm without murmer. No JVD or edema. Peripheral pulses intact. No carotid bruits. Abdomen: Nondistended. Soft and nontender. No masses palpable. No organomegaly. No palpable hernias. Extremities: No edema or joint swelling or deformity. Mild chronic venous stasis changes. Neurologic: Alert and fully oriented. Gait normal. No focal weakness. Psychiatric: Normal mood and affect. Thought content  appropriate with normal judgement and insight    Assessment & Plan Sharlet Salina T. Hoxworth MD; 09/13/2015 10:38 AM) MORBID OBESITY WITH BMI OF 50.0-59.9, ADULT (E66.01) Impression: Patient with  progressive morbid obesity unresponsive to multiple efforts at medical management who presents with a BMI of 56 and comorbidities of general joint disease, obstructive sleep apnea, hypertension and prediabetes. I believe there would be very significant medical benefit from surgical weight loss. After our discussion of surgical options currently available the patient has decided to proceed with laparoscopic sleeve gastrectomy. We have discussed the nature of the problem and the risks of remaining morbidly obese. We discussed laparoscopic sleeve gastrectomy in detail including the nature of the procedure, expected hospitalization and recovery, and major risks of anesthetic complications, bleeding, blood clots and pulmonary emboli, leakage and infection and long-term risks of stricture, , bowel obstruction, reflux, nutritional deficiencies, and failure to lose weight or weight regain. We discussed these problems could lead to death. We discussed that the procedure is not reversible. We discussed possible need for conversion to gastric bypass or other procedure. We reviewed the consent form and all his questions were answered. Current Plans  Schedule for Surgery Laparoscopic sleeve gastrectomy

## 2015-09-17 NOTE — Progress Notes (Signed)
Spoke with patient by phone, aware surgery time changed to 730 am, arrive 530 am, [facility] short stay

## 2015-09-18 ENCOUNTER — Encounter (HOSPITAL_COMMUNITY): Payer: Self-pay | Admitting: *Deleted

## 2015-09-18 ENCOUNTER — Inpatient Hospital Stay (HOSPITAL_COMMUNITY): Payer: Medicare Other | Admitting: Anesthesiology

## 2015-09-18 ENCOUNTER — Inpatient Hospital Stay (HOSPITAL_COMMUNITY)
Admission: RE | Admit: 2015-09-18 | Discharge: 2015-09-20 | DRG: 621 | Disposition: A | Discharge status: Home / Self Care | Payer: Medicare Other | Source: Ambulatory Visit | Attending: General Surgery | Admitting: General Surgery

## 2015-09-18 ENCOUNTER — Encounter (HOSPITAL_COMMUNITY)
Admission: RE | Disposition: A | Discharge status: Home / Self Care | Payer: Self-pay | Source: Ambulatory Visit | Attending: General Surgery

## 2015-09-18 DIAGNOSIS — M549 Dorsalgia, unspecified: Secondary | ICD-10-CM | POA: Diagnosis present

## 2015-09-18 DIAGNOSIS — Z8249 Family history of ischemic heart disease and other diseases of the circulatory system: Secondary | ICD-10-CM | POA: Diagnosis not present

## 2015-09-18 DIAGNOSIS — Z79891 Long term (current) use of opiate analgesic: Secondary | ICD-10-CM | POA: Diagnosis not present

## 2015-09-18 DIAGNOSIS — G4733 Obstructive sleep apnea (adult) (pediatric): Secondary | ICD-10-CM | POA: Diagnosis present

## 2015-09-18 DIAGNOSIS — G8929 Other chronic pain: Secondary | ICD-10-CM | POA: Diagnosis present

## 2015-09-18 DIAGNOSIS — E079 Disorder of thyroid, unspecified: Secondary | ICD-10-CM | POA: Diagnosis present

## 2015-09-18 DIAGNOSIS — I10 Essential (primary) hypertension: Secondary | ICD-10-CM | POA: Diagnosis present

## 2015-09-18 DIAGNOSIS — M25569 Pain in unspecified knee: Secondary | ICD-10-CM | POA: Diagnosis present

## 2015-09-18 DIAGNOSIS — Z6841 Body Mass Index (BMI) 40.0 and over, adult: Secondary | ICD-10-CM | POA: Diagnosis not present

## 2015-09-18 DIAGNOSIS — M1990 Unspecified osteoarthritis, unspecified site: Secondary | ICD-10-CM | POA: Diagnosis present

## 2015-09-18 DIAGNOSIS — Z79899 Other long term (current) drug therapy: Secondary | ICD-10-CM

## 2015-09-18 DIAGNOSIS — R7309 Other abnormal glucose: Secondary | ICD-10-CM | POA: Diagnosis present

## 2015-09-18 DIAGNOSIS — Z01812 Encounter for preprocedural laboratory examination: Secondary | ICD-10-CM

## 2015-09-18 DIAGNOSIS — Z808 Family history of malignant neoplasm of other organs or systems: Secondary | ICD-10-CM | POA: Diagnosis not present

## 2015-09-18 DIAGNOSIS — Z87891 Personal history of nicotine dependence: Secondary | ICD-10-CM

## 2015-09-18 DIAGNOSIS — K219 Gastro-esophageal reflux disease without esophagitis: Secondary | ICD-10-CM | POA: Diagnosis present

## 2015-09-18 DIAGNOSIS — K209 Esophagitis, unspecified: Secondary | ICD-10-CM | POA: Diagnosis present

## 2015-09-18 DIAGNOSIS — Z8042 Family history of malignant neoplasm of prostate: Secondary | ICD-10-CM | POA: Diagnosis not present

## 2015-09-18 HISTORY — PX: LAPAROSCOPIC GASTRIC SLEEVE RESECTION: SHX5895

## 2015-09-18 LAB — HEMOGLOBIN AND HEMATOCRIT, BLOOD
HCT: 42.7 % (ref 39.0–52.0)
Hemoglobin: 14 g/dL (ref 13.0–17.0)

## 2015-09-18 SURGERY — GASTRECTOMY, SLEEVE, LAPAROSCOPIC
Anesthesia: General | Site: Abdomen

## 2015-09-18 MED ORDER — ONDANSETRON HCL 4 MG/2ML IJ SOLN
4.0000 mg | INTRAMUSCULAR | Status: DC | PRN
  Administered 2015-09-18 – 2015-09-20 (×7): 4 mg via INTRAVENOUS
  Filled 2015-09-18 (×7): qty 2

## 2015-09-18 MED ORDER — OXYCODONE HCL 5 MG/5ML PO SOLN
5.0000 mg | ORAL | Status: DC | PRN
  Administered 2015-09-19 – 2015-09-20 (×3): 5 mg via ORAL
  Filled 2015-09-18 (×3): qty 5

## 2015-09-18 MED ORDER — CHLORHEXIDINE GLUCONATE CLOTH 2 % EX PADS: 6.0000 | MEDICATED_PAD | Freq: Once | CUTANEOUS | Status: DC

## 2015-09-18 MED ORDER — SODIUM CHLORIDE 0.9 % IJ SOLN
INTRAMUSCULAR | Status: AC
  Filled 2015-09-18: qty 20

## 2015-09-18 MED ORDER — PROMETHAZINE HCL 25 MG/ML IJ SOLN
6.2500 mg | INTRAMUSCULAR | Status: AC | PRN
  Administered 2015-09-18 (×2): 12.5 mg via INTRAVENOUS

## 2015-09-18 MED ORDER — ONDANSETRON HCL 4 MG/2ML IJ SOLN
INTRAMUSCULAR | Status: DC | PRN
  Administered 2015-09-18: 4 mg via INTRAVENOUS

## 2015-09-18 MED ORDER — SUGAMMADEX SODIUM 500 MG/5ML IV SOLN
INTRAVENOUS | Status: AC
  Filled 2015-09-18: qty 5

## 2015-09-18 MED ORDER — MORPHINE SULFATE (PF) 2 MG/ML IV SOLN
2.0000 mg | INTRAVENOUS | Status: DC | PRN
  Administered 2015-09-18 (×3): 6 mg via INTRAVENOUS
  Administered 2015-09-18: 4 mg via INTRAVENOUS
  Administered 2015-09-18: 6 mg via INTRAVENOUS
  Filled 2015-09-18 (×4): qty 3
  Filled 2015-09-18: qty 2
  Filled 2015-09-18: qty 3

## 2015-09-18 MED ORDER — EVICEL 5 ML EX KIT
PACK | Freq: Once | CUTANEOUS | Status: AC
  Administered 2015-09-18: 1
  Filled 2015-09-18: qty 1

## 2015-09-18 MED ORDER — HEPARIN SODIUM (PORCINE) 5000 UNIT/ML IJ SOLN
5000.0000 [IU] | INTRAMUSCULAR | Status: AC
  Administered 2015-09-18: 5000 [IU] via SUBCUTANEOUS
  Filled 2015-09-18: qty 1

## 2015-09-18 MED ORDER — MIDAZOLAM HCL 2 MG/2ML IJ SOLN
INTRAMUSCULAR | Status: AC
  Filled 2015-09-18: qty 4

## 2015-09-18 MED ORDER — PROPOFOL 10 MG/ML IV BOLUS
INTRAVENOUS | Status: AC
  Filled 2015-09-18: qty 20

## 2015-09-18 MED ORDER — ACETAMINOPHEN 160 MG/5ML PO SOLN: 325.0000 mg | ORAL | Status: DC | PRN

## 2015-09-18 MED ORDER — ONDANSETRON HCL 4 MG/2ML IJ SOLN
INTRAMUSCULAR | Status: AC
  Filled 2015-09-18: qty 2

## 2015-09-18 MED ORDER — POTASSIUM CHLORIDE IN NACL 20-0.9 MEQ/L-% IV SOLN
INTRAVENOUS | Status: DC
Start: 2015-09-18 — End: 2015-09-20
  Administered 2015-09-18: 1000 mL via INTRAVENOUS
  Administered 2015-09-18 – 2015-09-20 (×4): via INTRAVENOUS
  Filled 2015-09-18 (×7): qty 1000

## 2015-09-18 MED ORDER — FENTANYL CITRATE (PF) 100 MCG/2ML IJ SOLN
INTRAMUSCULAR | Status: AC
  Filled 2015-09-18: qty 4

## 2015-09-18 MED ORDER — DEXAMETHASONE SODIUM PHOSPHATE 10 MG/ML IJ SOLN
INTRAMUSCULAR | Status: AC
  Filled 2015-09-18: qty 1

## 2015-09-18 MED ORDER — LABETALOL HCL 5 MG/ML IV SOLN
INTRAVENOUS | Status: AC
  Filled 2015-09-18: qty 4

## 2015-09-18 MED ORDER — DEXTROSE 5 % IV SOLN
INTRAVENOUS | Status: AC
  Filled 2015-09-18: qty 2

## 2015-09-18 MED ORDER — BUPIVACAINE-EPINEPHRINE 0.25% -1:200000 IJ SOLN
INTRAMUSCULAR | Status: DC | PRN
  Administered 2015-09-18: 30 mL

## 2015-09-18 MED ORDER — PROMETHAZINE HCL 25 MG/ML IJ SOLN
INTRAMUSCULAR | Status: AC
  Filled 2015-09-18: qty 1

## 2015-09-18 MED ORDER — FENTANYL CITRATE (PF) 250 MCG/5ML IJ SOLN
INTRAMUSCULAR | Status: AC
  Filled 2015-09-18: qty 25

## 2015-09-18 MED ORDER — FENTANYL CITRATE (PF) 100 MCG/2ML IJ SOLN
25.0000 ug | INTRAMUSCULAR | Status: DC | PRN
  Administered 2015-09-18: 50 ug via INTRAVENOUS

## 2015-09-18 MED ORDER — ROCURONIUM BROMIDE 100 MG/10ML IV SOLN
INTRAVENOUS | Status: DC | PRN
  Administered 2015-09-18: 50 mg via INTRAVENOUS
  Administered 2015-09-18: 20 mg via INTRAVENOUS

## 2015-09-18 MED ORDER — ACETAMINOPHEN 10 MG/ML IV SOLN
1000.0000 mg | Freq: Four times a day (QID) | INTRAVENOUS | Status: AC
  Administered 2015-09-18 – 2015-09-19 (×4): 1000 mg via INTRAVENOUS
  Filled 2015-09-18 (×4): qty 100

## 2015-09-18 MED ORDER — PROPOFOL 10 MG/ML IV BOLUS
INTRAVENOUS | Status: AC
Start: 2015-09-18 — End: 2015-09-18
  Filled 2015-09-18: qty 20

## 2015-09-18 MED ORDER — LACTATED RINGERS IR SOLN
Status: DC | PRN
  Administered 2015-09-18: 1000 mL

## 2015-09-18 MED ORDER — ENOXAPARIN SODIUM 30 MG/0.3ML ~~LOC~~ SOLN
30.0000 mg | Freq: Two times a day (BID) | SUBCUTANEOUS | Status: DC
  Administered 2015-09-18 – 2015-09-20 (×4): 30 mg via SUBCUTANEOUS
  Filled 2015-09-18 (×6): qty 0.3

## 2015-09-18 MED ORDER — ROCURONIUM BROMIDE 100 MG/10ML IV SOLN
INTRAVENOUS | Status: AC
  Filled 2015-09-18: qty 1

## 2015-09-18 MED ORDER — BUPIVACAINE LIPOSOME 1.3 % IJ SUSP
20.0000 mL | Freq: Once | INTRAMUSCULAR | Status: AC
  Administered 2015-09-18: 20 mL
  Filled 2015-09-18: qty 20

## 2015-09-18 MED ORDER — LACTATED RINGERS IV SOLN
INTRAVENOUS | Status: DC | PRN
  Administered 2015-09-18 (×2): via INTRAVENOUS

## 2015-09-18 MED ORDER — LABETALOL HCL 5 MG/ML IV SOLN
INTRAVENOUS | Status: DC | PRN
  Administered 2015-09-18: 5 mg via INTRAVENOUS

## 2015-09-18 MED ORDER — PROPOFOL 10 MG/ML IV BOLUS
INTRAVENOUS | Status: DC | PRN
  Administered 2015-09-18: 300 mg via INTRAVENOUS

## 2015-09-18 MED ORDER — FENTANYL CITRATE (PF) 100 MCG/2ML IJ SOLN
INTRAMUSCULAR | Status: DC | PRN
  Administered 2015-09-18 (×3): 100 ug via INTRAVENOUS
  Administered 2015-09-18: 50 ug via INTRAVENOUS

## 2015-09-18 MED ORDER — UNJURY CHOCOLATE CLASSIC POWDER: 2.0000 [oz_av] | Freq: Four times a day (QID) | ORAL | Status: DC

## 2015-09-18 MED ORDER — ACETAMINOPHEN 160 MG/5ML PO SOLN: 650.0000 mg | ORAL | Status: DC | PRN

## 2015-09-18 MED ORDER — SUCCINYLCHOLINE CHLORIDE 20 MG/ML IJ SOLN
INTRAMUSCULAR | Status: DC | PRN
  Administered 2015-09-18: 160 mg via INTRAVENOUS

## 2015-09-18 MED ORDER — SODIUM CHLORIDE 0.9 % IJ SOLN
INTRAMUSCULAR | Status: DC | PRN
  Administered 2015-09-18: 20 mL

## 2015-09-18 MED ORDER — DEXAMETHASONE SODIUM PHOSPHATE 10 MG/ML IJ SOLN
INTRAMUSCULAR | Status: DC | PRN
  Administered 2015-09-18: 10 mg via INTRAVENOUS

## 2015-09-18 MED ORDER — LIDOCAINE HCL (CARDIAC) 20 MG/ML IV SOLN
INTRAVENOUS | Status: DC | PRN
  Administered 2015-09-18: 100 mg via INTRAVENOUS

## 2015-09-18 MED ORDER — 0.9 % SODIUM CHLORIDE (POUR BTL) OPTIME
TOPICAL | Status: DC | PRN
Start: 2015-09-18 — End: 2015-09-18
  Administered 2015-09-18: 1000 mL

## 2015-09-18 MED ORDER — UNJURY CHICKEN SOUP POWDER: 2.0000 [oz_av] | Freq: Four times a day (QID) | ORAL | Status: DC

## 2015-09-18 MED ORDER — CETYLPYRIDINIUM CHLORIDE 0.05 % MT LIQD
7.0000 mL | Freq: Two times a day (BID) | OROMUCOSAL | Status: DC
  Administered 2015-09-18 – 2015-09-20 (×2): 7 mL via OROMUCOSAL

## 2015-09-18 MED ORDER — PANTOPRAZOLE SODIUM 40 MG IV SOLR
40.0000 mg | Freq: Every day | INTRAVENOUS | Status: DC
  Administered 2015-09-18: 40 mg via INTRAVENOUS
  Filled 2015-09-18 (×3): qty 40

## 2015-09-18 MED ORDER — SUGAMMADEX SODIUM 200 MG/2ML IV SOLN
INTRAVENOUS | Status: DC | PRN
  Administered 2015-09-18: 500 mg via INTRAVENOUS

## 2015-09-18 MED ORDER — DEXTROSE 5 % IV SOLN
2.0000 g | INTRAVENOUS | Status: AC
  Administered 2015-09-18: 2 g via INTRAVENOUS

## 2015-09-18 MED ORDER — BUPIVACAINE-EPINEPHRINE (PF) 0.25% -1:200000 IJ SOLN
INTRAMUSCULAR | Status: AC
  Filled 2015-09-18: qty 30

## 2015-09-18 MED ORDER — UNJURY VANILLA POWDER: 2.0000 [oz_av] | Freq: Four times a day (QID) | ORAL | Status: DC

## 2015-09-18 MED ORDER — FENTANYL CITRATE (PF) 100 MCG/2ML IJ SOLN
INTRAMUSCULAR | Status: AC
  Filled 2015-09-18: qty 2

## 2015-09-18 MED ORDER — LIDOCAINE HCL (CARDIAC) 20 MG/ML IV SOLN
INTRAVENOUS | Status: AC
  Filled 2015-09-18: qty 5

## 2015-09-18 MED ORDER — MIDAZOLAM HCL 5 MG/5ML IJ SOLN
INTRAMUSCULAR | Status: DC | PRN
  Administered 2015-09-18: 2 mg via INTRAVENOUS

## 2015-09-18 MED ORDER — ENALAPRILAT 1.25 MG/ML IV SOLN
1.2500 mg | Freq: Four times a day (QID) | INTRAVENOUS | Status: DC | PRN
  Filled 2015-09-18: qty 1

## 2015-09-18 SURGICAL SUPPLY — 56 items
APPLICATOR COTTON TIP 6IN STRL (MISCELLANEOUS) IMPLANT
APPLIER CLIP ROT 10 11.4 M/L (STAPLE)
APPLIER CLIP ROT 13.4 12 LRG (CLIP)
BLADE SURG SZ11 CARB STEEL (BLADE) ×3 IMPLANT
CABLE HIGH FREQUENCY MONO STRZ (ELECTRODE) IMPLANT
CHLORAPREP W/TINT 26ML (MISCELLANEOUS) ×3 IMPLANT
CLIP APPLIE ROT 10 11.4 M/L (STAPLE) IMPLANT
CLIP APPLIE ROT 13.4 12 LRG (CLIP) IMPLANT
COVER SURGICAL LIGHT HANDLE (MISCELLANEOUS) ×3 IMPLANT
DEVICE SUTURE ENDOST 10MM (ENDOMECHANICALS) IMPLANT
DEVICE TROCAR PUNCTURE CLOSURE (ENDOMECHANICALS) ×3 IMPLANT
DRAPE CAMERA CLOSED 9X96 (DRAPES) ×3 IMPLANT
DRAPE UTILITY XL STRL (DRAPES) ×6 IMPLANT
ELECT REM PT RETURN 9FT ADLT (ELECTROSURGICAL) ×3
ELECTRODE REM PT RTRN 9FT ADLT (ELECTROSURGICAL) ×1 IMPLANT
GAUZE SPONGE 4X4 12PLY STRL (GAUZE/BANDAGES/DRESSINGS) IMPLANT
GLOVE BIOGEL PI IND STRL 7.5 (GLOVE) ×1 IMPLANT
GLOVE BIOGEL PI INDICATOR 7.5 (GLOVE) ×2
GLOVE ECLIPSE 7.5 STRL STRAW (GLOVE) ×3 IMPLANT
GOWN STRL REUS W/TWL XL LVL3 (GOWN DISPOSABLE) ×12 IMPLANT
HOVERMATT SINGLE USE (MISCELLANEOUS) ×3 IMPLANT
KIT BASIN OR (CUSTOM PROCEDURE TRAY) ×3 IMPLANT
LIQUID BAND (GAUZE/BANDAGES/DRESSINGS) ×3 IMPLANT
NEEDLE SPNL 22GX3.5 QUINCKE BK (NEEDLE) ×3 IMPLANT
PACK UNIVERSAL I (CUSTOM PROCEDURE TRAY) ×3 IMPLANT
PEN SKIN MARKING BROAD (MISCELLANEOUS) ×3 IMPLANT
POUCH SPECIMEN RETRIEVAL 10MM (ENDOMECHANICALS) IMPLANT
RELOAD STAPLER BLUE 60MM (STAPLE) ×1 IMPLANT
RELOAD STAPLER GOLD 60MM (STAPLE) ×1 IMPLANT
RELOAD STAPLER GREEN 60MM (STAPLE) ×4 IMPLANT
SCISSORS LAP 5X45 EPIX DISP (ENDOMECHANICALS) IMPLANT
SEALANT SURGICAL APPL DUAL CAN (MISCELLANEOUS) ×3 IMPLANT
SET IRRIG TUBING LAPAROSCOPIC (IRRIGATION / IRRIGATOR) ×3 IMPLANT
SET TUBE IRRIG SUCTION NO TIP (IRRIGATION / IRRIGATOR) ×3 IMPLANT
SHEARS CURVED HARMONIC AC 45CM (MISCELLANEOUS) ×3 IMPLANT
SLEEVE ADV FIXATION 5X100MM (TROCAR) ×3 IMPLANT
SLEEVE GASTRECTOMY 36FR VISIGI (MISCELLANEOUS) ×3 IMPLANT
SOLUTION ANTI FOG 6CC (MISCELLANEOUS) ×3 IMPLANT
SPONGE LAP 18X18 X RAY DECT (DISPOSABLE) ×3 IMPLANT
STAPLER ECHELON LONG 60 440 (INSTRUMENTS) ×3 IMPLANT
STAPLER RELOAD BLUE 60MM (STAPLE) ×3
STAPLER RELOAD GOLD 60MM (STAPLE) ×3
STAPLER RELOAD GREEN 60MM (STAPLE) ×12
SUT MNCRL AB 4-0 PS2 18 (SUTURE) ×3 IMPLANT
SUT VICRYL 0 TIES 12 18 (SUTURE) ×3 IMPLANT
SYR 10ML ECCENTRIC (SYRINGE) ×3 IMPLANT
SYR 20CC LL (SYRINGE) ×3 IMPLANT
TOWEL OR 17X26 10 PK STRL BLUE (TOWEL DISPOSABLE) ×3 IMPLANT
TOWEL OR NON WOVEN STRL DISP B (DISPOSABLE) ×3 IMPLANT
TROCAR ADV FIXATION 5X100MM (TROCAR) ×3 IMPLANT
TROCAR BLADELESS 15MM (ENDOMECHANICALS) ×3 IMPLANT
TROCAR XCEL NON-BLD 5MMX100MML (ENDOMECHANICALS) ×3 IMPLANT
TUBING CONNECTING 10 (TUBING) ×2 IMPLANT
TUBING CONNECTING 10' (TUBING) ×1
TUBING ENDO SMARTCAP PENTAX (MISCELLANEOUS) ×3 IMPLANT
TUBING FILTER THERMOFLATOR (ELECTROSURGICAL) ×3 IMPLANT

## 2015-09-18 NOTE — Interval H&P Note (Signed)
History and Physical Interval Note:  09/18/2015 7:16 AM  Robert Weiss  has presented today for surgery, with the diagnosis of Morbid Obesity  The various methods of treatment have been discussed with the patient and family. After consideration of risks, benefits and other options for treatment, the patient has consented to  Procedure(s): LAPAROSCOPIC GASTRIC SLEEVE RESECTION (N/A) as a surgical intervention .  The patient's history has been reviewed, patient examined, no change in status, stable for surgery.  I have reviewed the patient's chart and labs.  Questions were answered to the patient's satisfaction.     HOXWORTH,BENJAMIN T

## 2015-09-18 NOTE — Op Note (Signed)
Preoperative Diagnosis: Morbid Obesity  Postoprative Diagnosis: Morbid Obesity  Procedure: Procedure(s): LAPAROSCOPIC GASTRIC SLEEVE RESECTION   Surgeon: Glenna Fellows T   Assistants: Feliciana Rossetti  Anesthesia:  General endotracheal anesthesia  Indications: patient is a 59 year old male with progressive morbid obesity unresponsive to medical management who presents with a BMI of 58 and comorbidities of obstructive sleep apnea chronic joint pain, hypertension and prediabetes. After extensive preoperative discussion and workup detailed elsewhere we have elected to proceed with laparoscopic sleeve gastrectomy as surgical treatment for his morbid obesity.    Procedure Detail:  Patient was brought to the operating room, placed in supine position on the operating table, and general endotracheal anesthesia induced. He received preoperative IV antibiotics. Subcutaneous heparin was administered. PAS were in place. The abdomen was widely sterilely prepped and draped. Patient timeout was performed and correct procedure verified. Access was obtained with a 5 mm Optiview trocar in the left upper quadrant without difficulty and pneumoperitoneum established. Under direct vision a 5 mm trocar was placed laterally in the right upper quadrant, a 15 mm trocar in the right upper abdomen near the base of the falciform ligament, a 5 mm trocar above and to the left of the umbilicus for the camera port and an additional 5 mm trocar laterally. Through a 5 mm subxiphoid site the next retractor was placed in the left lobe of the liver elevated with excellent exposure of the stomach and hiatus. We measured 5 cm from the pylorus. At this point the lesser omentum was dissected from the greater curve of the stomach using Harmonic scalpel working proximally along the greater curve. The free lesser sac was entered after a few centimeters. The dissection progressed proximally along the greater curve dividing short gastric  vessels with the Harmonic scalpel. We continued proximally and dissected the stomach away from the spleen and dissection was carried down onto the left crus and final short gastric vessels were divided. The left crus was thoroughly dissected and the paraesophageal fat pad dissected off the crus. We then worked back proximally making sure there were no attachments posteriorly and the stomach was completely free along its lesser curve vasculature. We worked down a little more distally along the greater curve finishing this dissection until the greater curve was completely free to 5 cm from the pylorus. The VisiG gastric tube was passed orally and positioned with its tip at the pylorus along the lesser curve with the stomach splayed out symmetrically and it was placed on suction. The sleeve was started with an initial firing of the green load 60 mm echelon stapler beginning 5 mm from the pylorus angling up toward but staying well away from the incisura. A second green load 60 mm staple firing was used to get past the incisura staying somewhat away from the VisiG tube at this point. Following this 2 more green load firings were performed working right up along side the VisiG tube progressing proximally. 2 further gold load firings were used and then a final 2 blue load firings angling just outside the esophageal fat pad making sure there was not an excessive fundic pouch. The sleeve was very symmetrical without twisting or narrowing. The VisiG was used to inflate the stomach under saline irrigation and there was no evidence of leak. The tube was removed. Dr. Sheliah Hatch performed upper endoscopy showing no narrowing or bleeding or leak. The staple line was coated with Evaseal tissue sealant. The gastric specimen was brought out through the 15 mm trocar site after dilating  the fascia slightly. This fascial defect was closed with 2-0 Vicryl sutures. The Endo Close device. All trocar sites were then infiltrated under direct  vision with Exparel local anesthetic. The Nathanson retractor was removed under direct vision and all CO2 evacuated. Skin incisions were closed with subcuticular Monocryl and Dermabond. Sponge needle and instrument counts were correct.   Estimated Blood Loss:  Minimal         Drains: none  Blood Given: none          Specimens: gastric sleeve resection        Complications:  * No complications entered in OR log *         Disposition: PACU - hemodynamically stable.         Condition: stable

## 2015-09-18 NOTE — Anesthesia Procedure Notes (Signed)
Procedure Name: Intubation Date/Time: 09/18/2015 7:33 AM Performed by: Doran Clay Pre-anesthesia Checklist: Patient identified, Timeout performed, Emergency Drugs available, Suction available and Patient being monitored Patient Re-evaluated:Patient Re-evaluated prior to inductionOxygen Delivery Method: Circle system utilized Preoxygenation: Pre-oxygenation with 100% oxygen Intubation Type: IV induction Laryngoscope Size: Mac and 4 Grade View: Grade I Tube type: Oral Tube size: 7.5 mm Number of attempts: 1 Airway Equipment and Method: Stylet Placement Confirmation: ETT inserted through vocal cords under direct vision,  breath sounds checked- equal and bilateral and positive ETCO2 Secured at: 23 cm Tube secured with: Tape Dental Injury: Teeth and Oropharynx as per pre-operative assessment

## 2015-09-18 NOTE — Anesthesia Postprocedure Evaluation (Signed)
  Anesthesia Post-op Note  Patient: Robert Weiss  Procedure(s) Performed: Procedure(s): LAPAROSCOPIC GASTRIC SLEEVE RESECTION (N/A)  Patient Location: PACU  Anesthesia Type:General  Level of Consciousness: awake  Airway and Oxygen Therapy: Patient Spontanous Breathing  Post-op Pain: mild  Post-op Assessment: Post-op Vital signs reviewed              Post-op Vital Signs: Reviewed  Last Vitals:  Filed Vitals:   09/18/15 1135  BP: 155/85  Pulse: 74  Temp: 36.7 C  Resp: 18    Complications: No apparent anesthesia complications

## 2015-09-18 NOTE — Anesthesia Preprocedure Evaluation (Addendum)
Anesthesia Evaluation  Patient identified by MRN, date of birth, ID band Patient awake    Reviewed: Allergy & Precautions, NPO status , Patient's Chart, lab work & pertinent test results  Airway Mallampati: II  TM Distance: >3 FB Neck ROM: Full    Dental   Pulmonary sleep apnea , former smoker,    breath sounds clear to auscultation       Cardiovascular hypertension,  Rhythm:Regular Rate:Normal     Neuro/Psych    GI/Hepatic Neg liver ROS, GERD  ,  Endo/Other    Renal/GU negative Renal ROS     Musculoskeletal  (+) Arthritis ,   Abdominal   Peds  Hematology   Anesthesia Other Findings   Reproductive/Obstetrics                            Anesthesia Physical Anesthesia Plan  ASA: III  Anesthesia Plan: General   Post-op Pain Management:    Induction: Intravenous  Airway Management Planned: Oral ETT  Additional Equipment:   Intra-op Plan:   Post-operative Plan: Possible Post-op intubation/ventilation  Informed Consent: I have reviewed the patients History and Physical, chart, labs and discussed the procedure including the risks, benefits and alternatives for the proposed anesthesia with the patient or authorized representative who has indicated his/her understanding and acceptance.   Dental advisory given  Plan Discussed with: CRNA and Anesthesiologist  Anesthesia Plan Comments:        Anesthesia Quick Evaluation

## 2015-09-18 NOTE — Transfer of Care (Signed)
Immediate Anesthesia Transfer of Care Note  Patient: Robert Weiss  Procedure(s) Performed: Procedure(s): LAPAROSCOPIC GASTRIC SLEEVE RESECTION (N/A)  Patient Location: PACU  Anesthesia Type:General  Level of Consciousness: sedated  Airway & Oxygen Therapy: Patient Spontanous Breathing and Patient connected to face mask oxygen  Post-op Assessment: Report given to RN and Post -op Vital signs reviewed and stable  Post vital signs: Reviewed and stable  Last Vitals:  Filed Vitals:   09/18/15 0500  BP: 159/79  Pulse: 71  Temp: 36.6 C  Resp: 20    Complications: No apparent anesthesia complications

## 2015-09-18 NOTE — Interval H&P Note (Signed)
History and Physical Interval Note:  09/18/2015 7:17 AM  Robert Weiss  has presented today for surgery, with the diagnosis of Morbid Obesity  The various methods of treatment have been discussed with the patient and family. After consideration of risks, benefits and other options for treatment, the patient has consented to  Procedure(s): LAPAROSCOPIC GASTRIC SLEEVE RESECTION (N/A) as a surgical intervention .  The patient's history has been reviewed, patient examined, no change in status, stable for surgery.  I have reviewed the patient's chart and labs.  Questions were answered to the patient's satisfaction.     HOXWORTH,BENJAMIN T

## 2015-09-18 NOTE — Progress Notes (Signed)
Utilization review completed.  

## 2015-09-18 NOTE — Op Note (Signed)
Preoperative diagnosis: laparoscopic sleeve gastrectomy  Postoperative diagnosis: Same   Procedure: Upper endoscopy   Surgeon: Feliciana Rossetti, M.D.  Anesthesia: Gen.   Indications for procedure: This patient was undergoing a laparoscopic sleeve gastrectomy.   Description of procedure: The endoscopy was placed in the mouth and into the oropharynx and under endoscopic vision it was advanced to the esophagogastric junction. The pouch was insufflated and no bleeding or bubbles were seen. The GEJ was identified at 38cm from the teeth. Esophagitis present for 4cm after GEJ. No bleeding or leaks were detected. The scope was withdrawn without difficulty.   Feliciana Rossetti, M.D. General, Bariatric, & Minimally Invasive Surgery [facility]

## 2015-09-19 LAB — CBC WITH DIFFERENTIAL/PLATELET
BASOS ABS: 0 10*3/uL (ref 0.0–0.1)
BASOS PCT: 0 %
EOS ABS: 0 10*3/uL (ref 0.0–0.7)
Eosinophils Relative: 0 %
HEMATOCRIT: 36.6 % — AB (ref 39.0–52.0)
HEMOGLOBIN: 12 g/dL — AB (ref 13.0–17.0)
Lymphocytes Relative: 9 %
Lymphs Abs: 1.6 10*3/uL (ref 0.7–4.0)
MCH: 28.9 pg (ref 26.0–34.0)
MCHC: 32.8 g/dL (ref 30.0–36.0)
MCV: 88.2 fL (ref 78.0–100.0)
Monocytes Absolute: 1.3 10*3/uL — ABNORMAL HIGH (ref 0.1–1.0)
Monocytes Relative: 7 %
NEUTROS ABS: 14.4 10*3/uL — AB (ref 1.7–7.7)
NEUTROS PCT: 84 %
Platelets: 303 10*3/uL (ref 150–400)
RBC: 4.15 MIL/uL — ABNORMAL LOW (ref 4.22–5.81)
RDW: 14.4 % (ref 11.5–15.5)
WBC: 17.2 10*3/uL — AB (ref 4.0–10.5)

## 2015-09-19 LAB — HEMOGLOBIN AND HEMATOCRIT, BLOOD
HEMATOCRIT: 33.5 % — AB (ref 39.0–52.0)
HEMOGLOBIN: 11.3 g/dL — AB (ref 13.0–17.0)

## 2015-09-19 NOTE — Progress Notes (Signed)
Patient states he can place himself on CPAP when ready. RT will continue to monitor.  

## 2015-09-19 NOTE — Progress Notes (Signed)
Patient ID: Bea Graff, male   DOB: [birth year], 59 y.o.   MRN: [id number] 1 Day Post-Op  Subjective: Sore when moves, no severe pain.  Some nausea without vomiting, up OOB  Objective: Vital signs in last 24 hours: Temp:  [97.7 F (36.5 C)-98.8 F (37.1 C)] 97.7 F (36.5 C) (09/28 0610) Pulse Rate:  [71-85] 85 (09/28 0610) Resp:  [17-22] 18 (09/28 0610) BP: (124-175)/(66-103) 140/80 mmHg (09/28 0610) SpO2:  [92 %-100 %] 100 % (09/28 0610) Last BM Date: 09/17/15  Intake/Output from previous day: 09/27 0701 - 09/28 0700 In: 3525 [I.V.:3325; IV Piggyback:200] Out: 1000 [Urine:1000] Intake/Output this shift:    General appearance: alert, cooperative and no distress GI: normal findings: soft, non-tender Incision/Wound: Clean and dry  Lab Results:   Recent Labs  09/18/15 1012 09/19/15 0623  WBC  --  17.2*  HGB 14.0 12.0*  HCT 42.7 36.6*  PLT  --  303   BMET No results for input(s): NA, K, CL, CO2, GLUCOSE, BUN, CREATININE, CALCIUM in the last 72 hours.   Studies/Results: No results found.  Anti-infectives: Anti-infectives    Start     Dose/Rate Route Frequency Ordered Stop   09/18/15 0527  cefOXitin (MEFOXIN) 2 g in dextrose 5 % 50 mL IVPB     2 g 100 mL/hr over 30 Minutes Intravenous On call to O.R. 09/18/15 0527 09/18/15 0740      Assessment/Plan: s/p Procedure(s): LAPAROSCOPIC GASTRIC SLEEVE RESECTION Stable post op.  Start POD 1 diet    LOS: 1 day    HOXWORTH,BENJAMIN T 09/19/2015

## 2015-09-19 NOTE — Plan of Care (Signed)
Problem: Food- and Nutrition-Related Knowledge Deficit (NB-1.1) Goal: Nutrition education Formal process to instruct or train a patient/client in a skill or to impart knowledge to help patients/clients voluntarily manage or modify food choices and eating behavior to maintain or improve health. Outcome: Completed/Met Date Met:  09/19/15 Nutrition Education Note  Received consult for diet education per DROP protocol.   Discussed 2 week post op diet with pt. Emphasized that liquids must be non carbonated, non caffeinated, and sugar free. Fluid goals discussed. Pt to follow up with outpatient bariatric RD for further diet progression after 2 weeks. Multivitamins and minerals also reviewed. Teach back method used, pt expressed understanding, expect good compliance.   Diet: First 2 Weeks  You will see the nutritionist about two (2) weeks after your surgery. The nutritionist will increase the types of foods you can eat if you are handling liquids well:  If you have severe vomiting or nausea and cannot handle clear liquids lasting longer than 1 day, call your surgeon  Protein Shake  Drink at least 2 ounces of shake 5-6 times per day  Each serving of protein shakes (usually 8 - 12 ounces) should have a minimum of:  15 grams of protein  And no more than 5 grams of carbohydrate  Goal for protein each day:  Men = 80 grams per day  Women = 60 grams per day  Protein powder may be added to fluids such as non-fat milk or Lactaid milk or Soy milk (limit to 35 grams added protein powder per serving)   Hydration  Slowly increase the amount of water and other clear liquids as tolerated (See Acceptable Fluids)  Slowly increase the amount of protein shake as tolerated  Sip fluids slowly and throughout the day  May use sugar substitutes in small amounts (no more than 6 - 8 packets per day; i.e. Splenda)   Fluid Goal  The first goal is to drink at least 8 ounces of protein shake/drink per day (or as directed  by the nutritionist); some examples of protein shakes are Syntrax Nectar, Adkins Advantage, EAS Edge HP, and Unjury. See handout from pre-op Bariatric Education Class:  Slowly increase the amount of protein shake you drink as tolerated  You may find it easier to slowly sip shakes throughout the day  It is important to get your proteins in first  Your fluid goal is to drink 64 - 100 ounces of fluid daily  It may take a few weeks to build up to this  32 oz (or more) should be clear liquids  And  32 oz (or more) should be full liquids (see below for examples)  Liquids should not contain sugar, caffeine, or carbonation   Clear Liquids:  Water or Sugar-free flavored water (i.e. Fruit H2O, Propel)  Decaffeinated coffee or tea (sugar-free)  Crystal Lite, Wyler's Lite, Minute Maid Lite  Sugar-free Jell-O  Bouillon or broth  Sugar-free Popsicle: *Less than 20 calories each; Limit 1 per day   Full Liquids:  Protein Shakes/Drinks + 2 choices per day of other full liquids  Full liquids must be:  No More Than 12 grams of Carbs per serving  No More Than 3 grams of Fat per serving  Strained low-fat cream soup  Non-Fat milk  Fat-free Lactaid Milk  Sugar-free yogurt (Dannon Lite & Fit, Greek yogurt)     Lindsey Baker, MS, RD, LDN Pager: [phone] After Hours Pager: [phone]        

## 2015-09-20 LAB — CBC WITH DIFFERENTIAL/PLATELET
Basophils Absolute: 0 10*3/uL (ref 0.0–0.1)
Basophils Relative: 0 %
EOS ABS: 0.1 10*3/uL (ref 0.0–0.7)
EOS PCT: 0 %
HCT: 31.1 % — ABNORMAL LOW (ref 39.0–52.0)
Hemoglobin: 10.2 g/dL — ABNORMAL LOW (ref 13.0–17.0)
LYMPHS ABS: 2.3 10*3/uL (ref 0.7–4.0)
LYMPHS PCT: 16 %
MCH: 29.3 pg (ref 26.0–34.0)
MCHC: 32.8 g/dL (ref 30.0–36.0)
MCV: 89.4 fL (ref 78.0–100.0)
MONO ABS: 1.8 10*3/uL — AB (ref 0.1–1.0)
MONOS PCT: 12 %
Neutro Abs: 10.5 10*3/uL — ABNORMAL HIGH (ref 1.7–7.7)
Neutrophils Relative %: 72 %
PLATELETS: 261 10*3/uL (ref 150–400)
RBC: 3.48 MIL/uL — ABNORMAL LOW (ref 4.22–5.81)
RDW: 15 % (ref 11.5–15.5)
WBC: 14.6 10*3/uL — AB (ref 4.0–10.5)

## 2015-09-20 MED ORDER — PREMIER PROTEIN SHAKE
2.0000 [oz_av] | Freq: Four times a day (QID) | ORAL | Status: DC
  Administered 2015-09-20: 2 [oz_av] via ORAL
  Filled 2015-09-20: qty 325.31

## 2015-09-20 NOTE — Discharge Instructions (Signed)

## 2015-09-20 NOTE — Progress Notes (Signed)
Patient alert and oriented, pain is controlled. Patient is tolerating fluids,  advanced to protein shake today, patient tolerated well.  Reviewed Gastric sleeve discharge instructions with patient and patient is able to articulate understanding.  Provided information on BELT program, Support Group and [facility] outpatient pharmacy. All questions answered, will continue to monitor.  

## 2015-09-20 NOTE — Progress Notes (Signed)
Assessment unchanged. Pt and wife verballized understanding of dc instructions through teach back including follow up care and when to call MD. Bariatric teaching completed by Karilyn Cota. Discharged via wc to front entrance to meet awaiting vehicle to carry home. Accompanied by wife and NT.

## 2015-09-20 NOTE — Discharge Summary (Signed)
Physician Discharge Summary  Patient ID: Robert Weiss MRN: [id number] DOB/AGE: 09/13/1956 59 y.o.  Admit date: 09/18/2015 Discharge date: 09/20/2015  Admission Diagnoses:  Discharge Diagnoses:  Active Problems:   Morbid obesity with BMI of 50.0-59.9, adult   Discharged Condition: good  Hospital Course: 59 yo male underwent laparoscopic sleeve gastrectomy. Postoperatively he did fairly well. Had some eructations but otherwise tolerating diet.  Consults: None  Significant Diagnostic Studies: labs: Hgb 10.2  Treatments: lap sleeve  Discharge Exam: Blood pressure 145/55, pulse 77, temperature 98.5 F (36.9 C), temperature source Oral, resp. rate 18, height  (1.753 m), weight 159.666 kg (352 lb), SpO2 95 %. Head: Normocephalic, without obvious abnormality, atraumatic Resp: clear to auscultation bilaterally Cardio: regular rate and rhythm, S1, S2 normal, no murmur, click, rub or gallop GI: soft, non-tender; bowel sounds normal; no masses,  no organomegaly  Disposition: 01-Home or Self Care  Discharge Instructions    Discharge instructions    Complete by:  As directed   Continue ambulating. Continue full liquid bariatric diet.     Increase activity slowly    Complete by:  As directed             Medication List    STOP taking these medications        etodolac 500 MG tablet  Commonly known as:  LODINE     HYDROcodone-acetaminophen 5-325 MG tablet  Commonly known as:  NORCO      TAKE these medications        aspirin 81 MG tablet  Take 81 mg by mouth daily.  Notes to Patient:  Avoid NSAIDs for 6-8 weeks after surgery     enalapril 10 MG tablet  Commonly known as:  VASOTEC  Take 10 mg by mouth every morning.  Notes to Patient:  Monitor Blood Pressure Daily and keep a log for primary care physician.  You may need to make changes to your medications with rapid weight loss.       fish oil-omega-3 fatty acids 1000 MG capsule  Take 2 g by mouth daily.  Notes to  Patient:  This medication may be too large to swallow, may need to find a smaller formula or chewable     glucosamine-chondroitin 500-400 MG tablet  Take 1 tablet by mouth 3 (three) times daily.  Notes to Patient:  This medication may be too large to swallow, may need to find a smaller formula or chewable     levothyroxine 75 MCG tablet  Commonly known as:  SYNTHROID, LEVOTHROID  Take 75 mcg by mouth daily before breakfast.           Follow-up Information    Follow up with Mariella Saa, MD. Go on 10/03/2015.   Specialty:  General Surgery   Why:  For Post-Op Check at 11:30 Am   Contact information:   [address] [place] [address] [phone]       Signed: De Blanch Kinsinger 09/20/2015, 1:05 PM

## 2015-09-24 ENCOUNTER — Telehealth (HOSPITAL_COMMUNITY): Payer: Self-pay

## 2015-09-24 NOTE — Telephone Encounter (Signed)

## 2015-10-02 ENCOUNTER — Encounter: Payer: Self-pay | Admitting: Dietician

## 2015-10-02 ENCOUNTER — Encounter: Payer: Medicare Other | Attending: General Surgery

## 2015-10-02 VITALS — Ht 69.0 in | Wt 338.5 lb

## 2015-10-02 DIAGNOSIS — Z713 Dietary counseling and surveillance: Secondary | ICD-10-CM | POA: Insufficient documentation

## 2015-10-02 DIAGNOSIS — Z6841 Body Mass Index (BMI) 40.0 and over, adult: Secondary | ICD-10-CM | POA: Diagnosis not present

## 2015-10-03 NOTE — Progress Notes (Signed)
Bariatric Class:  Appt start time: 1530 end time:  1630.  2 Week Post-Operative Nutrition Class  Patient was seen on 10/03/15 for Post-Operative Nutrition education at [facility].   Surgery date: 09/18/15 Surgery type: Sleeve Gastrectomy Start weight at [facility]: 379 lbs on 04/23/15 Weight today: 338.5 lbs  Weight change: 39.2 lbs  TANITA  BODY COMP RESULTS  09/03/15 10/03/15   BMI (kg/m^2) N/A 50   Fat Mass (lbs)  168.0   Fat Free Mass (lbs)  170.5   Total Body Water (lbs)  125.0     The following the learning objectives were met by the patient during this course:  Identifies Phase 3A (Soft, High Proteins) Dietary Goals and will begin from 2 weeks post-operatively to 2 months post-operatively  Identifies appropriate sources of fluids and proteins   States protein recommendations and appropriate sources post-operatively  Identifies the need for appropriate texture modifications, mastication, and bite sizes when consuming solids  Identifies appropriate multivitamin and calcium sources post-operatively  Describes the need for physical activity post-operatively and will follow MD recommendations  States when to call healthcare provider regarding medication questions or post-operative complications  Handouts given during class include:  Phase 3A: Soft, High Protein Diet Handout  Follow-Up Plan: Patient will follow-up at [facility] in 6 weeks for 2 month post-op nutrition visit for diet advancement per MD.

## 2015-11-13 ENCOUNTER — Encounter: Payer: Self-pay | Admitting: Dietician

## 2015-11-13 ENCOUNTER — Encounter: Payer: Medicare Other | Attending: General Surgery | Admitting: Dietician

## 2015-11-13 VITALS — Ht 69.0 in | Wt 307.0 lb

## 2015-11-13 DIAGNOSIS — Z713 Dietary counseling and surveillance: Secondary | ICD-10-CM | POA: Insufficient documentation

## 2015-11-13 DIAGNOSIS — Z6841 Body Mass Index (BMI) 40.0 and over, adult: Secondary | ICD-10-CM | POA: Insufficient documentation

## 2015-11-13 NOTE — Patient Instructions (Addendum)
Goals:  Follow Phase 3B: High Protein + Non-Starchy Vegetables  Eat 3-6 small meals/snacks, every 3-5 hrs  Increase lean protein foods to meet 60g goal  Increase fluid intake to 64oz +  Avoid drinking 15 minutes before, during and 30 minutes after eating  Aim for >30 min of physical activity daily  Surgery date: 09/18/15 Surgery type: Sleeve Gastrectomy [facility] lbs on 04/23/15 Weight today: 307.0 lbs  Weight change: 31.5 lbs Total weight loss: 72 lbs  TANITA  BODY COMP RESULTS  09/03/15 10/03/15 11/13/15   BMI (kg/m^2) N/A 50 45.3   Fat Mass (lbs)  168.0 149.0   Fat Free Mass (lbs)  170.5 158.0   Total Body Water (lbs)  125.0 115.5

## 2015-11-13 NOTE — Progress Notes (Signed)
  Follow-up visit:  8 Weeks Post-Operative Sleeve Gastrectomy Surgery  Medical Nutrition Therapy:  Appt start time: 1005 end time:  1030.  Primary concerns today: Post-operative Bariatric Surgery Nutrition Management. Returns with a 31.5 lb weight loss. Finding that "nothing tastes good" though no problems tolerating foods. Has some knee pain so has trouble walking for a long time, though doing some hunting in the evenings.   Surgery date: 09/18/15 Surgery type: Sleeve Gastrectomy [facility] lbs on 04/23/15 Weight today: 307.0 lbs  Weight change: 31.5 lbs Total weight loss: 72 lbs  TANITA  BODY COMP RESULTS  09/03/15 10/03/15 11/13/15   BMI (kg/m^2) N/A 50 45.3   Fat Mass (lbs)  168.0 149.0   Fat Free Mass (lbs)  170.5 158.0   Total Body Water (lbs)  125.0 115.5     Preferred Learning Style:   No preference indicated   Learning Readiness:   Ready  24-hr recall: B (AM): Premier Protein (30 g) Snk (AM): pork skins  L (PM): soup (cream of mushroom/chicken) or 2 pieces of ham and 1 piece heese roll (7-10 g) Snk (PM): oikos triple zero yogurt (15 g)   D (PM): 1 oz meat and green bean or other bean (7-10 g) Snk (PM): none or milk or another protein shake  Tries to get in 2 shakes and 1 yogurt per day   Fluid intake: 2 protein shakes (22 oz), 32-40 oz water 54-62 oz total Estimated total protein intake: ~89 g  Medications: see list  Supplementation: taking  Using straws: No Drinking while eating: No Hair loss: No (NA) Carbonated beverages: No N/V/D/C: nauseas several times if eats too much, constipation and takes benefiber if he needs it Dumping syndrome: No  Recent physical activity:  Walks everyday for 5-10 minutes with dogs  Progress Towards Goal(s):  In progress.  Handouts given during visit include:  Phase 3B High Protein + Non Starchy Vegetables   Nutritional Diagnosis:  [place]-3.3 Overweight/obesity related to past poor dietary habits and physical  inactivity as evidenced by patient w/ recent sleeve gastrectomy surgery following dietary guidelines for continued weight loss.    Intervention:  Nutrition education/diet advancement. Goals:  Follow Phase 3B: High Protein + Non-Starchy Vegetables  Eat 3-6 small meals/snacks, every 3-5 hrs  Increase lean protein foods to meet 60g goal  Increase fluid intake to 64oz +  Avoid drinking 15 minutes before, during and 30 minutes after eating  Aim for >30 min of physical activity daily   Teaching Method Utilized:  Visual Auditory Hands on  Barriers to learning/adherence to lifestyle change: none  Demonstrated degree of understanding via:  Teach Back   Monitoring/Evaluation:  Dietary intake, exercise, and body weight. Follow up in 1 months for 3 month post-op visit.

## 2015-12-21 ENCOUNTER — Encounter: Payer: Self-pay | Admitting: Dietician

## 2015-12-21 ENCOUNTER — Encounter: Payer: Medicare Other | Attending: General Surgery | Admitting: Dietician

## 2015-12-21 VITALS — Ht 69.0 in | Wt 287.5 lb

## 2015-12-21 DIAGNOSIS — Z713 Dietary counseling and surveillance: Secondary | ICD-10-CM | POA: Insufficient documentation

## 2015-12-21 DIAGNOSIS — Z6841 Body Mass Index (BMI) 40.0 and over, adult: Secondary | ICD-10-CM | POA: Insufficient documentation

## 2015-12-21 NOTE — Patient Instructions (Addendum)
Goals:  Follow Phase 3B: High Protein + Non-Starchy Vegetables  Eat 3-6 small meals/snacks, every 3-5 hrs  Keep working on increasing lean protein foods to meet 80g goal  Seafood, eggs, venison, cheese, chicken, beans, cottage cheese  Increase fluid intake to 64oz +  Avoid drinking 15 minutes before, during and 30 minutes after eating  Aim for >30 min of physical activity daily  Limit fruit to 1/2 cup or less and include protein food with it   Surgery date: 09/18/15 Surgery type: Sleeve Gastrectomy [facility] lbs on 04/23/15 Weight today: 287.5 lbs Weight change: 20 lbs Total weight loss: 91.5 lbs  TANITA  BODY COMP RESULTS  09/03/15 10/03/15 11/13/15 12/21/15   BMI (kg/m^2) N/A 50 45.3 42.5   Fat Mass (lbs)  168.0 149.0 124   Fat Free Mass (lbs)  170.5 158.0 163.5   Total Body Water (lbs)  125.0 115.5 119.5

## 2015-12-21 NOTE — Progress Notes (Signed)
  Follow-up visit:  3 months Post-Operative Sleeve Gastrectomy Surgery  Medical Nutrition Therapy:  Appt start time: 925 end time:  945  Primary concerns today: Post-operative Bariatric Surgery Nutrition Management. Returns with a 20 lb weight loss. Still finding that foods don't taste as good, especially proteins. Not having trouble tolerating any foods.  Surgery date: 09/18/15 Surgery type: Sleeve Gastrectomy [facility] lbs on 04/23/15 Weight today: 287.5 lbs Weight change: 20 lbs Total weight loss: 91.5 lbs  TANITA  BODY COMP RESULTS  09/03/15 10/03/15 11/13/15 12/21/15   BMI (kg/m^2) N/A 50 45.3 42.5   Fat Mass (lbs)  168.0 149.0 124   Fat Free Mass (lbs)  170.5 158.0 163.5   Total Body Water (lbs)  125.0 115.5 119.5     Preferred Learning Style:   No preference indicated   Learning Readiness:   Ready  24-hr recall: B (AM): Premier Protein (30 g) Snk (AM): pork skins  L (PM): raw veggies with pimento cheese or cheese or meat (7g) Snk (PM): oikos triple zero yogurt (15 g)   D (PM): 1 oz venison and vegetable soup (7g) Snk (PM): sometimes yogurt or jello or pork skins or celery with pimento cheese   Tries to get in 1-2 shakes most days  Fluid intake: 1-2 protein shakes (11-22 oz), 32-40 oz water or Propel, 54-62 oz total Estimated total protein intake: 60-80 g  Medications: see list, no longer taking blood pressure medication  Supplementation: taking  Using straws: No Drinking while eating: No Hair loss: No (NA) Carbonated beverages: No N/V/D/C: vomited 1x on Thanksgiving; constipation Dumping syndrome: No  Recent physical activity: hunting and walking, inconsistent  Progress Towards Goal(s):  In progress.    Nutritional Diagnosis:  [place]-3.3 Overweight/obesity related to past poor dietary habits and physical inactivity as evidenced by patient w/ recent sleeve gastrectomy surgery following dietary guidelines for continued weight loss.     Intervention:  Nutrition education/diet advancement.   Teaching Method Utilized:  Visual Auditory Hands on  Barriers to learning/adherence to lifestyle change: none  Demonstrated degree of understanding via:  Teach Back   Monitoring/Evaluation:  Dietary intake, exercise, and body weight. Follow up in 2 months for 5 month post-op visit.

## 2016-01-16 IMAGING — RF DG UGI W/ KUB
14 of 24 series · 14 of 24 positions shown · non-contrast
Comparison: None.

CLINICAL DATA: Bariatric screening.

EXAM:
UPPER GI SERIES WITH KUB
TECHNIQUE: After obtaining a scout radiograph a routine upper GI series was
performed using thin barium
FLUOROSCOPY TIME:  Radiation Exposure Index (as provided by the
fluoroscopic device):
If the device does not provide the exposure index:
Fluoroscopy Time (in minutes and seconds):  2 minutes 48 seconds
Number of Acquired Images:  2

[Series 1: run · 1 of 1 slices shown (1 of 14)]
[im 1/1]
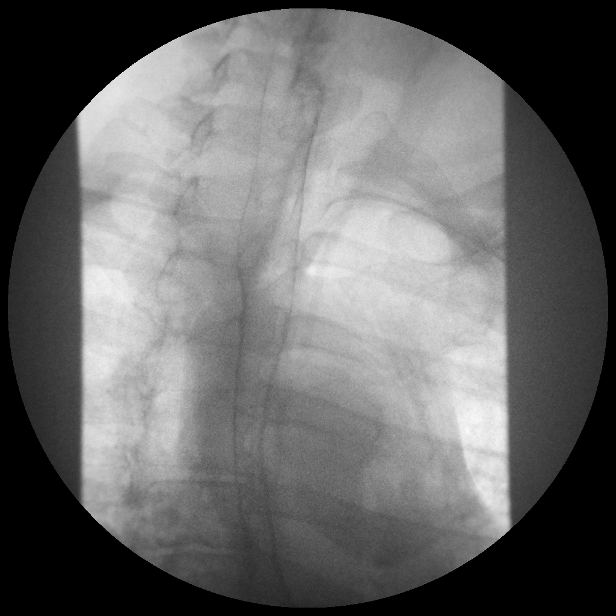

[Series 3: run · 1 of 1 slices shown (2 of 14)]
[im 1/1]
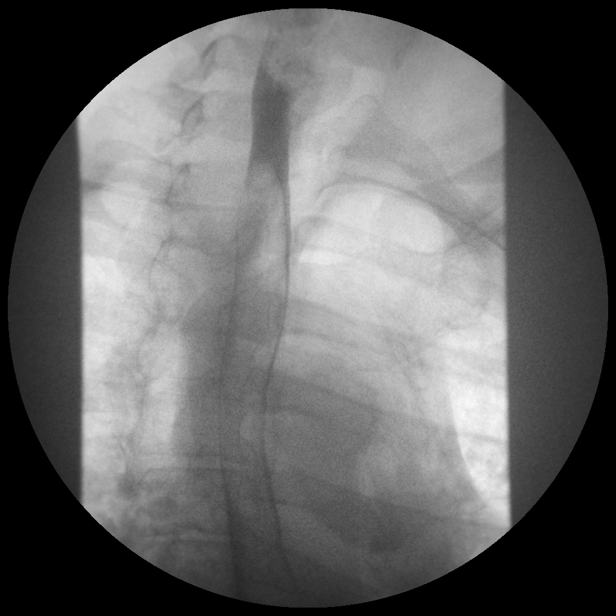

[Series 5: run · 1 of 1 slices shown (3 of 14)]
[im 1/1]
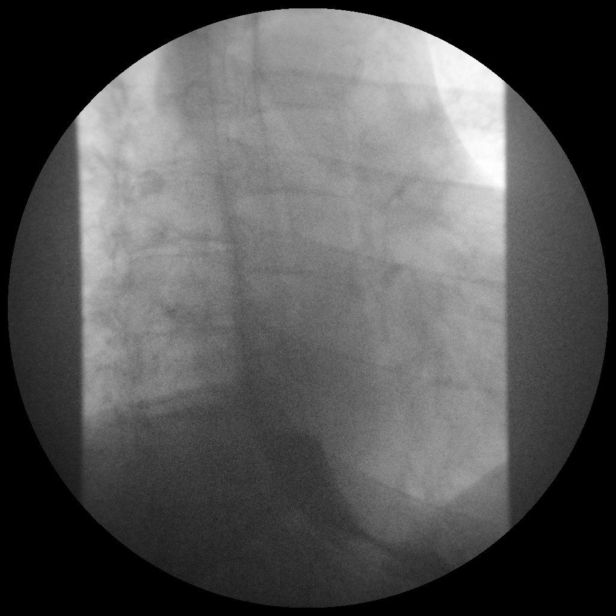

[Series 7: run · 1 of 1 slices shown (4 of 14)]
[im 1/1]
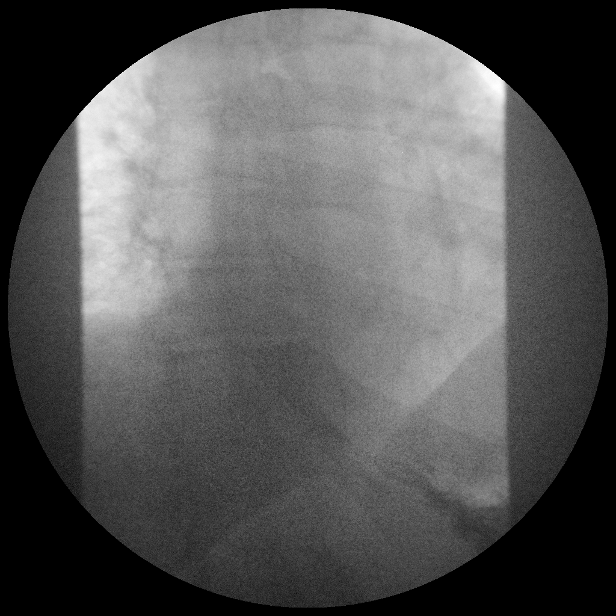

[Series 8: run · 1 of 1 slices shown (5 of 14)]
[im 1/1]
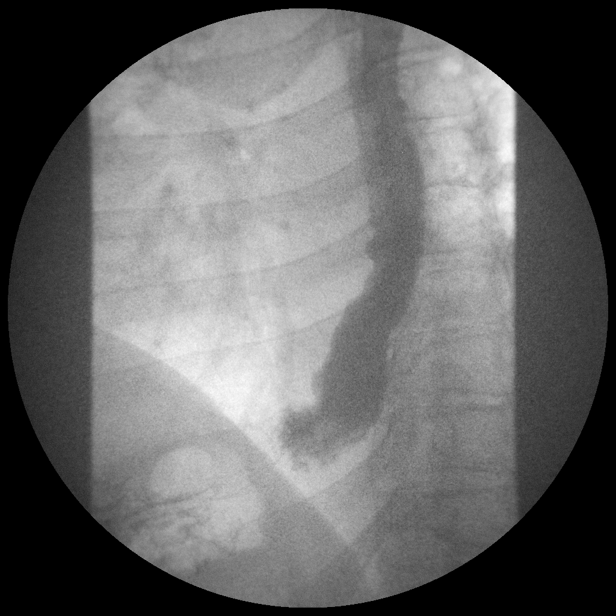

[Series 10: run · 1 of 1 slices shown (6 of 14)]
[im 1/1]
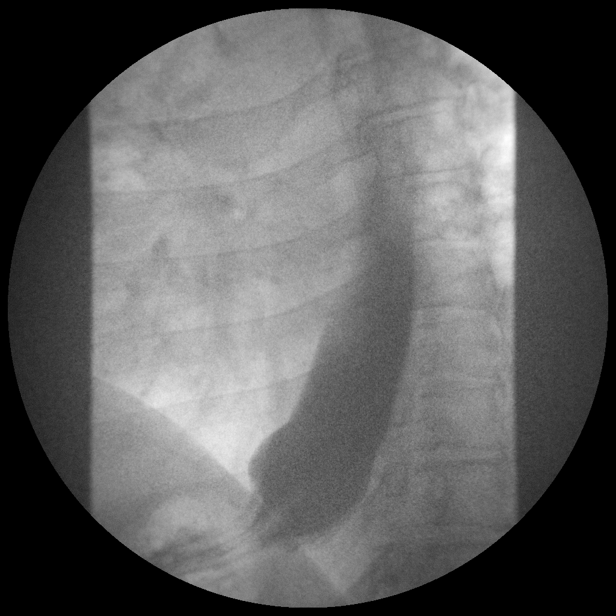

[Series 12: run · 1 of 1 slices shown (7 of 14)]
[im 1/1]
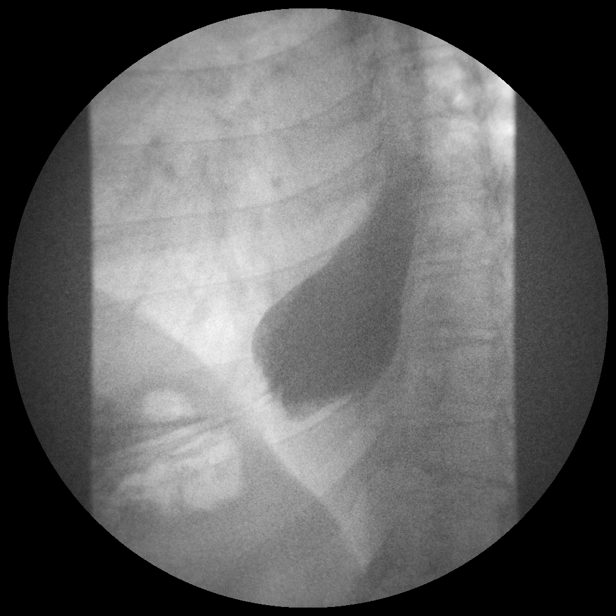

[Series 13: run · 1 of 1 slices shown (8 of 14)]
[im 1/1]
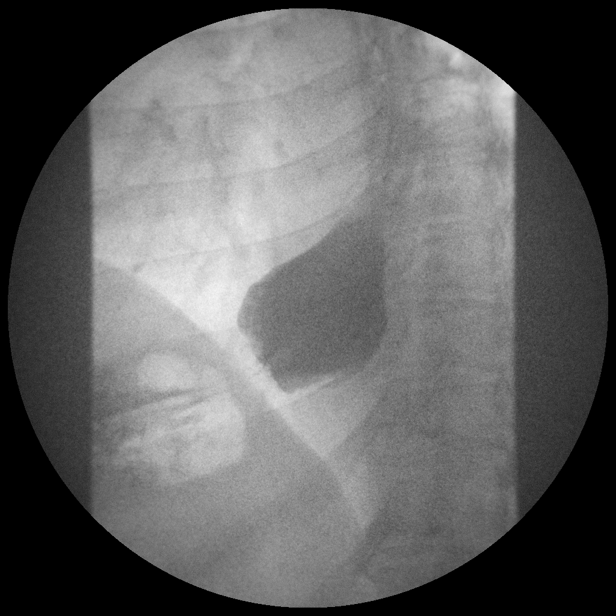

[Series 15: run · 1 of 1 slices shown (9 of 14)]
[im 1/1]
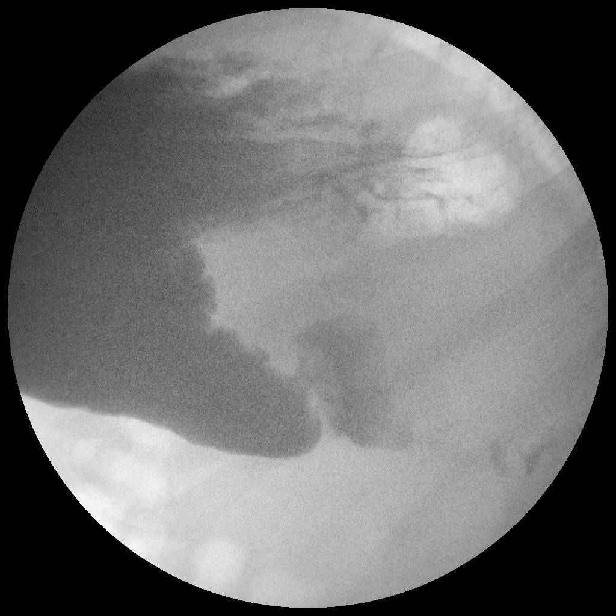

[Series 17: run · 1 of 1 slices shown (10 of 14)]
[im 1/1]
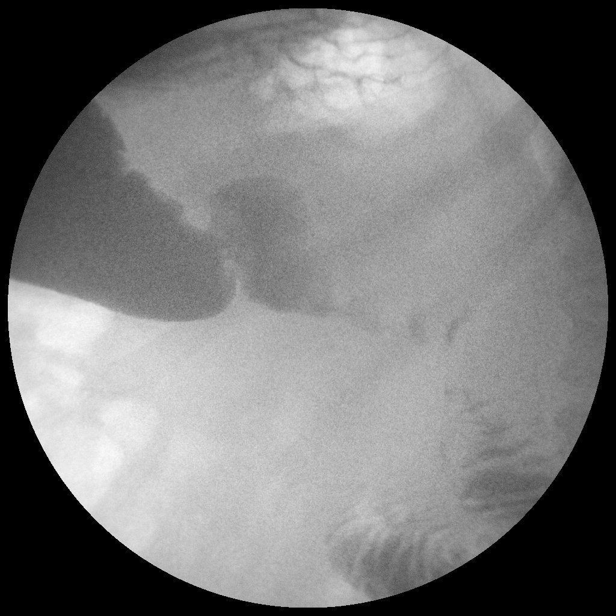

[Series 19: run · 1 of 1 slices shown (11 of 14)]
[im 1/1]
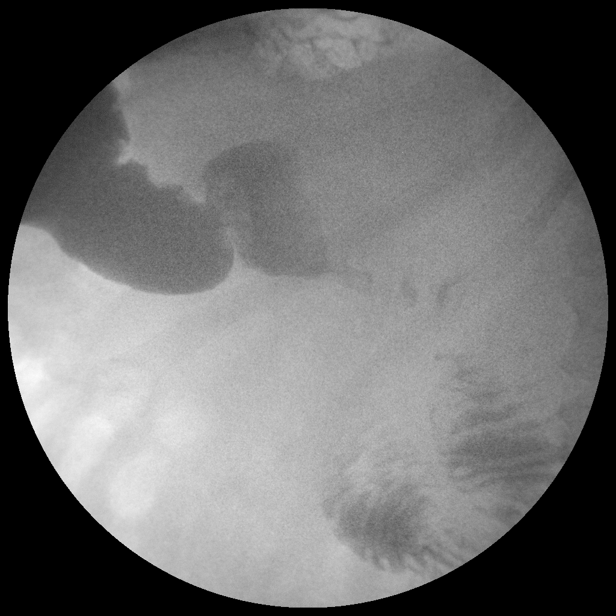

[Series 20: run · 1 of 1 slices shown (12 of 14)]
[im 1/1]
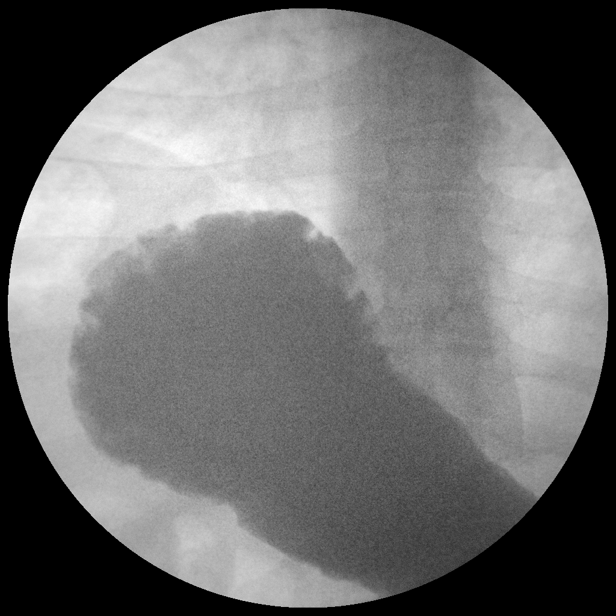

[Series 22: run · 1 of 1 slices shown (13 of 14)]
[im 1/1]
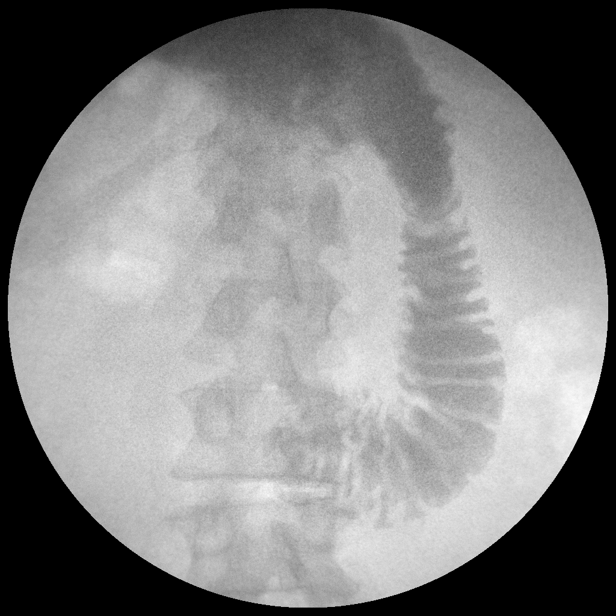

[Series 24: run · 1 of 1 slices shown (14 of 14)]
[im 1/1]
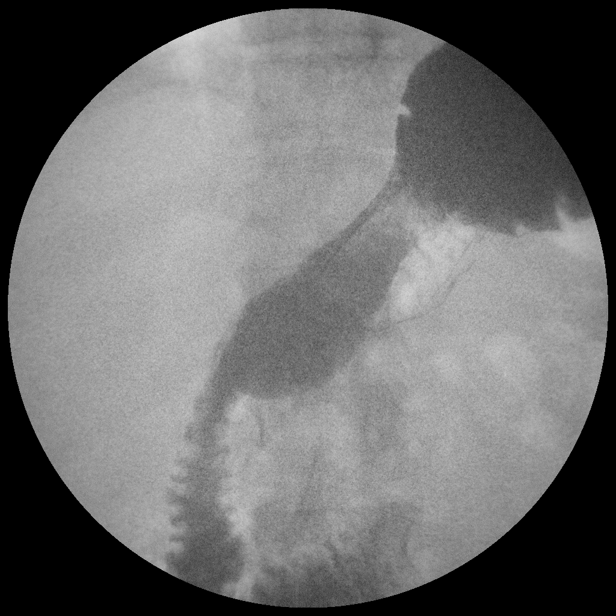

[14 of 24 positions shown; findings below may reference images not displayed]

FINDINGS: Fluoroscopic evaluation of swallowing demonstrates disruption [DATE] primary esophageal peristaltic waves. No fold thickening,
stricture or esophageal mass. Stomach, duodenal bulb and duodenal
sweep are unremarkable. There is a small amount of gastroesophageal
reflux noted spontaneously and with the water siphon maneuver.
IMPRESSION: Nonspecific esophageal motility disorder.

Small amount of gastroesophageal reflux.

## 2016-02-22 ENCOUNTER — Other Ambulatory Visit: Payer: Self-pay

## 2016-02-22 ENCOUNTER — Ambulatory Visit: Payer: Self-pay | Admitting: Dietician

## 2016-03-13 ENCOUNTER — Encounter: Payer: Medicare Other | Attending: General Surgery | Admitting: Dietician

## 2016-03-13 ENCOUNTER — Encounter: Payer: Self-pay | Admitting: Dietician

## 2016-03-13 DIAGNOSIS — Z713 Dietary counseling and surveillance: Secondary | ICD-10-CM | POA: Insufficient documentation

## 2016-03-13 DIAGNOSIS — Z6838 Body mass index (BMI) 38.0-38.9, adult: Secondary | ICD-10-CM | POA: Insufficient documentation

## 2016-03-13 NOTE — Progress Notes (Signed)
  Follow-up visit:  6 months Post-Operative Sleeve Gastrectomy Surgery  Medical Nutrition Therapy:  Appt start time: 840 end time:  900  Primary concerns today: Post-operative Bariatric Surgery Nutrition Management. Returns with a 27.5 lb weight loss in the last 3 months. No longer on any medications or CPAP. Has been exercising more lately and increasing intensity. Some foods still "don't taste right." He reports that if he has only a banana for afternoon snack he will increase protein intake at evening meal.   Surgery date: 09/18/15 Surgery type: Sleeve Gastrectomy [facility] lbs on 04/23/15 Weight today: 260 lbs Weight change: 27.5 lbs Total weight loss: 119 lbs  Goal weight 245 lbs  TANITA  BODY COMP RESULTS  09/03/15 10/03/15 11/13/15 12/21/15 03/13/16   BMI (kg/m^2) N/A 50 45.3 42.5 38.4   Fat Mass (lbs)  168.0 149.0 124 94.5   Fat Free Mass (lbs)  170.5 158.0 163.5 165.5   Total Body Water (lbs)  125.0 115.5 119.5 121     Preferred Learning Style:   No preference indicated   Learning Readiness:   Ready  24-hr recall: B (AM): Premier Protein and regular coffee (30 g) Snk (AM):  L (PM): 2-3 oz meat (venison, pork, salmon or chicken) and salad or vegetable (14-21g) Snk (PM): banana or Premier protein bar (0-20g) D (PM): 2-3 oz venison with green beans and carrots (14-21g) Snk (PM): usually none, maybe some peanuts or pork skins or sugar free jello  Fluid intake: 1 protein shakes (11oz), 34-51 oz water Estimated total protein intake: 80-100 g per patient  Medications: see list Supplementation: taking  Using straws: No Drinking while eating: No Hair loss: unknown  Carbonated beverages: has had 2 sodas since September N/V/D/C: constipation, takes Miralax and feels like this helps Dumping syndrome: No  Recent physical activity: 3x a week in home gym (20 minutes on bike, 10 minutes on treadmill, weights on [employer])  Progress Towards Goal(s):  In  progress.    Nutritional Diagnosis:  [place]-3.3 Overweight/obesity related to past poor dietary habits and physical inactivity as evidenced by patient w/ recent sleeve gastrectomy surgery following dietary guidelines for continued weight loss.    Intervention:  Nutrition education/diet advancement.   Teaching Method Utilized:  Visual Auditory Hands on  Barriers to learning/adherence to lifestyle change: none  Demonstrated degree of understanding via:  Teach Back   Monitoring/Evaluation:  Dietary intake, exercise, and body weight. Follow up in 6 months for 12 month post-op visit.

## 2016-03-13 NOTE — Patient Instructions (Addendum)
Goals:  Follow Phase 3B: High Protein + Non-Starchy Vegetables  Eat 3-6 small meals/snacks, every 3-5 hrs  Keep working on increasing lean protein foods to meet 80g goal  Seafood, eggs, venison, cheese, chicken, beans, cottage cheese  Increase fluid intake to 64oz +  Avoid drinking 15 minutes before, during and 30 minutes after eating  Aim for >30 min of physical activity daily  Limit fruit to 1/2 cup or less and include protein food with it   Surgery date: 09/18/15 Surgery type: Sleeve Gastrectomy [facility]: [facility] lbs on 04/23/15 Weight today: 260 lbs Weight change: 27.5 lbs Total weight loss: 119 lbs  Goal weight 245 lbs  TANITA  BODY COMP RESULTS  09/03/15 10/03/15 11/13/15 12/21/15 03/13/16   BMI (kg/m^2) N/A 50 45.3 42.5 38.4   Fat Mass (lbs)  168.0 149.0 124 94.5   Fat Free Mass (lbs)  170.5 158.0 163.5 165.5   Total Body Water (lbs)  125.0 115.5 119.5 121

## 2016-09-19 ENCOUNTER — Ambulatory Visit: Payer: Self-pay | Admitting: Dietician

## 2018-04-19 ENCOUNTER — Encounter (HOSPITAL_COMMUNITY): Payer: Self-pay

## 2024-10-12 ENCOUNTER — Telehealth: Payer: Self-pay | Admitting: Oncology

## 2024-10-12 NOTE — Telephone Encounter (Addendum)
 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  They are planning to give pt his first Tx on 10/24/24 and have requested pt be scheduled for a new consult a week after.

## 2024-10-14 ENCOUNTER — Other Ambulatory Visit: Payer: Self-pay | Admitting: Pharmacist

## 2024-10-14 ENCOUNTER — Encounter: Payer: Self-pay | Admitting: Oncology

## 2024-10-14 DIAGNOSIS — C155 Malignant neoplasm of lower third of esophagus: Secondary | ICD-10-CM | POA: Insufficient documentation

## 2024-10-16 ENCOUNTER — Other Ambulatory Visit: Payer: Self-pay

## 2024-10-17 ENCOUNTER — Encounter: Payer: Self-pay | Admitting: Oncology

## 2024-10-25 ENCOUNTER — Encounter: Payer: Self-pay | Admitting: Oncology

## 2024-10-25 NOTE — Progress Notes (Signed)
 Pt arrived for  home Neulasta injection that will go off in 27 hours (wearable injector). Labs within parameters for Tx today. V/S stable. Pt education regarding medications and managing side effects reinforced during Tx today. Pt tolerated injection well with no S/Sx of adverse reactions noted or reported. AVS declined as pt utilizes his pt portal.. Pt aware of upcoming appointments. Discharged home ambulatory with self.

## 2024-10-26 ENCOUNTER — Encounter: Payer: Self-pay | Admitting: Oncology

## 2024-10-26 NOTE — Progress Notes (Signed)
 Oncology Navigation Follow Up Encounter  Received a call from the patient. Patient reported that the adhesive at the bottom of the Neulasta OBI has come loose. Patient stated that the indicator light on the OBI remains green. Advised the patient to reinforce the edges of the OBI with tape. Instructed the patient to call the office if any leaking occurs during the injection or if the indicator light turns red. Patient verbalized understanding.   ONC Nurse JonelleBETHA Cools Up       09/22/2024 09/26/2024 09/27/2024 10/24/2024 10/26/2024  Wrap Up  Initiated by: gardiner) Navigator Navigator Navigator Navigator Patient  Type of contact - Care coordination Initial assessment Follow-up Follow-up  Cancer phase New diagnosis/staging New diagnosis/staging New diagnosis/staging Active Tx Active Tx  Intervention Chart review;See Phone Note;Information provided Chart review;See Phone Note;Information provided;Coordination of care;Needs assessment;Support provided Chart review;Goals discussion;See Phone Note;Information provided;Needs assessment;Coordination of care;Support provided Chart review;See Phone Note;Needs assessment;Support provided;Information provided Chart review;See Phone Note;Information provided;Needs assessment;Support provided     The plan for the patient is : keep scheduled appointments.   All questions answered to patient satisfaction and patient encouraged to communicate back if further needs or concerns arise.    Electronically signed by: Delon KATHEE Dawn, RN 10/26/2024 2:28 PM

## 2024-10-27 NOTE — Progress Notes (Signed)
 Udenyca given and tolerated

## 2024-10-27 NOTE — Progress Notes (Signed)
 Oncology Navigation Follow Up Encounter  Patient had Neulasta Onpro applied on 11/4. The patient called to report concern that the full dose may not have been administered, as the injector appeared wet and the patient's wife noticed moisture on his abdomen. Dr. Leverette was notified and requested that the patient be scheduled for a growth factor injection either today or tomorrow. The patient was informed and agreed to come today at 3:00 PM for the injection. He was instructed to bring the Neulasta Onpro device to the appointment.   ONC Nurse Quickpick:  Coordination of Care       09/27/2024  Coordination of care  Institution/Systems Issues (check all that apply) Information Delivery;Ease of making appts  Care Coordination Facilitate communication with providers;Appointment coordination  Cancer Navigation Referrals Nurse navigator     The plan for the patient is : keep scheduled appointments.   All questions answered to patient satisfaction and patient encouraged to communicate back if further needs or concerns arise.    Electronically signed by: Delon KATHEE Dawn, RN 10/27/2024 11:56 AM

## 2024-10-30 ENCOUNTER — Other Ambulatory Visit: Payer: Self-pay

## 2024-10-30 NOTE — Progress Notes (Unsigned)
 [facility] at [facility] [address] [place],  [place]  [address] [phone]  Clinic Day: 10/31/2024  Referring physician: Leverette DROP., MD   HISTORY OF PRESENT ILLNESS:  The patient is a 68 y.o. male  who I was asked to consult upon for the continued management of his  newly diagnosed esophageal cancer.  According to the patient, he had an unintentional weight loss of 30 pounds over 6 months.  Based upon this, his primary care office ordered CT scans in July 2025, which showed irregularities in his lower esophagus.  Eventually, A PET scan was done in early October 2025, which showed a tumor in his distal esophagus.  Furthermore, hypermetabolic paraesophageal, gastrohepatic and perigastric lymph nodes were appreciated.  An EUS was done approximately 2 weeks later, which showed his esophageal tumor eroding through his muscularis propria.  Pathologic regional lymphadenopathy was also seen.  A biopsy of his esophageal mass showed poorly differentiated disease which happened to be HER2 positive.  A celiac lymph node was also biopsied, which came back consistent with malignant nodal spread.  The patient brings to my attention that he has had gastroesophageal reflux disease for the past 20 years.  He claims dysphagia only became an issue less than a month ago.  He denies having any odynophagia.  Of note, he received his 1st cycle of FLOT on November 3rd, for which he tolerated very well.  He transferred his care to our facility as it is closer to his home.  PAST MEDICAL HISTORY:   Past Medical History:  Diagnosis Date   Arthritis    shoulders, hands, knees, back-continues   Cough 03/25/2012   Esophageal cancer (HCC)    GERD (gastroesophageal reflux disease)    no recent issues or long term problem -10 yrs ago.   Hx MRSA infection 10 yrs. ago   right and left legs- tested and treated out of [place] network-Oral antibiotics only- no further problems   Hypertension     under control, has been on med. x 7-8 yrs.   Hypothyroidism    Nasal congestion 03/25/2012   PPD positive, treated    6 years ago- No active disease   Retained orthopedic hardware 03/22/2012   left wrist   Seasonal allergies    Sleep apnea    cpap 24-27 ,moisture at 4 using nightly    PAST SURGICAL HISTORY:   Past Surgical History:  Procedure Laterality Date   CATARACT EXTRACTION Bilateral    ESOPHAGEAL BIOPSY     X 2   FINGER AMPUTATION Right 30 yrs. ago   INDEX FINGER - RESULT OF INDUSTRIAL ACCIDENT   HARDWARE REMOVAL  03/30/2012   Procedure: HARDWARE REMOVAL;  Surgeon: Franky JONELLE Curia, MD;  Location: [facility];  Service: Orthopedics;  Laterality: Left;  left wrist   KNEE ARTHROPLASTY Bilateral    LAPAROSCOPIC GASTRIC SLEEVE RESECTION N/A 09/18/2015   Procedure: LAPAROSCOPIC GASTRIC SLEEVE RESECTION;  Surgeon: Morene Olives, MD;  Location: [facility];  Service: General;  Laterality: N/A;   WRIST FUSION Left 03/18/2011   STT FUSION - RESULT OF CRUSH INJURY    CURRENT MEDICATIONS:   Current Outpatient Medications  Medication Sig Dispense Refill   acetaminophen  (TYLENOL ) 500 MG tablet Take 1,000 mg by mouth.     bupivacaine , PF, (MARCAINE ) 0.25 % SOLN injection Inject 8 mLs into the skin.     dexamethasone  (DECADRON ) 4 MG tablet 4 mg po bid the day before and the day after chemotherapy  ferrous sulfate 325 (65 FE) MG EC tablet Take 325 mg by mouth.     gabapentin (NEURONTIN) 300 MG capsule Take 300 mg by mouth.     LORazepam (ATIVAN) 1 MG tablet Take 1 tablet (1 mg) by mouth 1 hour prior to imaging scan     ondansetron  (ZOFRAN ) 4 MG tablet Take 4 mg by mouth every 8 (eight) hours as needed.     pantoprazole  (PROTONIX ) 40 MG tablet Take 40 mg by mouth daily.     tamsulosin (FLOMAX) 0.4 MG CAPS capsule Take 0.4 mg by mouth.     triamcinolone acetonide (KENALOG-40) 40 MG/ML injection Inject 80 mg into the articular space.     aspirin 81 MG tablet Take 81 mg  by mouth daily.     enalapril (VASOTEC) 10 MG tablet Take 10 mg by mouth every morning. Reported on 12/21/2015     fish oil-omega-3 fatty acids 1000 MG capsule Take 2 g by mouth daily.     glucosamine-chondroitin 500-400 MG tablet Take 1 tablet by mouth 3 (three) times daily.     levothyroxine (SYNTHROID, LEVOTHROID) 75 MCG tablet Take 75 mcg by mouth daily before breakfast.     No current facility-administered medications for this visit.    ALLERGIES:   Allergies  Allergen Reactions   Percocet [Oxycodone -Acetaminophen ] Nausea And Vomiting   Oxycodone -Acetaminophen  Nausea And Vomiting and Hives    FAMILY HISTORY:   Family History  Problem Relation Age of Onset   Other Mother        KILLED IN MVA WITH FATHER WHEN PT WAS 76 YEARS OLD   Other Father        KILLED IN MVA WITH MOTHER WHEN PT WAS 60 YEARS OLD   Prostate cancer Brother    Prostate cancer Brother    Heart disease Brother    Periodic limb movement Brother    Other Brother        LYME DISEASE   Heart attack Brother    Other Brother        KILLED IN [place]   Melanoma Brother    Lung cancer Maternal Uncle     SOCIAL HISTORY:  The patient was born and raised in [place].  He currently lives in [place] with his wife of 37 years.  They have 1 child.  He retired from a engineer, drilling.  He also worked for numerous years with the [employer].  This patient has smoked as much as 2 packs of cigarettes daily for 55 years.  He claims he is currently down to three-fourths of a pack daily.  REVIEW OF SYSTEMS:  Review of Systems  Constitutional:  Positive for fatigue. Negative for fever and unexpected weight change.  Respiratory:  Positive for cough. Negative for chest tightness, hemoptysis and shortness of breath.   Cardiovascular:  Negative for chest pain and palpitations.  Gastrointestinal:  Positive for constipation, diarrhea and nausea. Negative for abdominal distention, abdominal pain,  blood in stool and vomiting.  Genitourinary:  Negative for dysuria, frequency and hematuria.   Musculoskeletal:  Positive for back pain. Negative for arthralgias and myalgias.  Skin:  Negative for itching and rash.  Neurological:  Positive for headaches. Negative for dizziness and light-headedness.  Psychiatric/Behavioral:  Negative for depression and suicidal ideas. The patient is not nervous/anxious.     PHYSICAL EXAM:  Blood pressure 136/81, pulse 61, temperature 98.5 F (36.9 C), temperature source Oral, resp. rate 16, height 5' 9 (1.753 m),  weight 253 lb (114.8 kg), SpO2 98%. Wt Readings from Last 3 Encounters:  10/31/24 253 lb (114.8 kg)  03/13/16 260 lb (117.9 kg)  12/21/15 287 lb 8 oz (130.4 kg)   Body mass index is 37.36 kg/m. Performance status (ECOG): 1 - Symptomatic but completely ambulatory Physical Exam Constitutional:      Appearance: Normal appearance. He is obese. He is not ill-appearing.  HENT:     Mouth/Throat:     Mouth: Mucous membranes are moist.     Pharynx: Oropharynx is clear. No oropharyngeal exudate or posterior oropharyngeal erythema.  Cardiovascular:     Rate and Rhythm: Normal rate and regular rhythm.     Heart sounds: No murmur heard.    No friction rub. No gallop.  Pulmonary:     Effort: Pulmonary effort is normal. No respiratory distress.     Breath sounds: Normal breath sounds. No wheezing, rhonchi or rales.  Abdominal:     General: Bowel sounds are normal. There is no distension.     Palpations: Abdomen is soft. There is no mass.     Tenderness: There is no abdominal tenderness.  Musculoskeletal:        General: No swelling.     Right lower leg: No edema.     Left lower leg: No edema.  Lymphadenopathy:     Cervical: No cervical adenopathy.     Upper Body:     Right upper body: No supraclavicular or axillary adenopathy.     Left upper body: No supraclavicular or axillary adenopathy.     Lower Body: No right inguinal adenopathy. No left  inguinal adenopathy.  Skin:    General: Skin is warm.     Coloration: Skin is not jaundiced.     Findings: No lesion or rash.  Neurological:     General: No focal deficit present.     Mental Status: He is alert and oriented to person, place, and time. Mental status is at baseline.  Psychiatric:        Mood and Affect: Mood normal.        Behavior: Behavior normal.        Thought Content: Thought content normal.    LABS:      Latest Ref Rng & Units 10/31/2024    2:38 PM 09/20/2015    5:43 AM 09/19/2015    5:02 PM  CBC  WBC 4.0 - 10.5 K/uL 12.7  14.6    Hemoglobin 13.0 - 17.0 g/dL 88.4  89.7  88.6   Hematocrit 39.0 - 52.0 % 36.5  31.1  33.5   Platelets 150 - 400 K/uL 324  261        Latest Ref Rng & Units 10/31/2024    2:38 PM 09/13/2015   11:45 AM 03/26/2012   12:25 PM  CMP  Glucose 70 - 99 mg/dL 83  94  89   BUN 8 - 23 mg/dL 10  21  11    Creatinine 0.61 - 1.24 mg/dL 9.04  8.87  9.06   Sodium 135 - 145 mmol/L 140  138  139   Potassium 3.5 - 5.1 mmol/L 3.8  4.4  5.0   Chloride 98 - 111 mmol/L 106  102  105   CO2 22 - 32 mmol/L 22  23  25    Calcium 8.9 - 10.3 mg/dL 8.9  89.9  9.6   Total Protein 6.5 - 8.1 g/dL 6.3  7.9    Total Bilirubin 0.0 - 1.2 mg/dL 0.4  0.6    Alkaline Phos 38 - 126 U/L 137  64    AST 15 - 41 U/L 27  30    ALT 0 - 44 U/L 31  33     PATHOLOGY:   Esophageal mass, endoscopic biopsy: At least intramucosal carcinoma, poorly differentiated, 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    Electronically signed by Eva Suzann Joanie Vita, MD on 10/11/2024 at 1412 EDT  Comment   Paraffin embedded tumor tissue (block A1) was submitted for immunohistochemistry to exclude neuroendocrine and squamous differentiation and to investigate DNA mismatch repair protein expression with the following results:   Immunohistochemical stains  Block Stain Result  A1-2 CK5/6 Negative  A1-3 p63 Negative  A1-4 Synaptophysin  Negative  A1-5 CD56  Positive  A1-6 Chromogranin A  Negative  A1-17 MLH1 Retained  A1-18 MSH2 Retained    A1-19 MSH6 Retained    A1-20 PMS2 Retained      INTERPRETATION:  -Immunohistochemical evidence of neuroendocrine differentiation; not diagnostic of neuroendocrine carcinoma. -No defect in DNA mismatch repair is detected, low probability of microsatellite instability-high (MSI-H). Internal controls are adequate.     Diagnosis   A.  CELIAC LYMPH NODE, CORE BIOPSY WITH TOUCH PREPS:              Malignant cells present, consistent with metastatic carcinoma.              See comment.    Electronically signed by Jeraldine Cohn, MD on 10/10/2024 at 1114 EDT  Comment   Immunostains with appropriate controls show that tumor cells are positive for pancytokeratin (AE1/3) and cytokeratin 7, and negative for cytokeratin 20, p40 and INSM-1.   Addendum 2   HER2 Biomarker Testing   Block Number: A1   Result:   HER2:                Positive  (Score 3+)     COMMENT: Fixation time was in compliance with CAP/ASCO guidelines.   The external immunohistochemical controls stained appropriately.     ASSESSMENT & PLAN:  A 68 y.o. 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  As mentioned previously, this gentleman has already received his first of 4 cycles of neoadjuvant FLOT chemotherapy.  He is scheduled for his second cycle of neoadjuvant chemotherapy on Monday, November 17th.  Of note, recent studies have shown an event free survival seen with FLOT/Durvalumab in gastroesophageal junction/gastric cancers versus FLOT alone, as per The phase 3 [facility]TERHORN study.  When considering that this gentleman's cancer is in his lower esophagus and he has multiple regional lymph node stations involving cancer, I do believe it is worth giving him the most aggressive neoadjuvant/adjuvant regimen he can receive, which can hopefully eradicate all of his disease.  Our  office did get in contact with his insurance company, who has approved him to have Durvalumab included with all future cycles of FLOT chemotherapy.  As mentioned previously, his second cycle of neoadjuvant treatment is scheduled for Monday, November 17th.  I will see him back 2 weeks later before he heads into cycle 3 of FLOT/Durvalumab.  The patient understands all the plans discussed today and is in agreement with them.  I do appreciate Leverette DROP., MD for his new consult.   Raliegh Scobie DELENA Kerns, MD

## 2024-10-31 ENCOUNTER — Encounter: Payer: Self-pay | Admitting: Oncology

## 2024-10-31 ENCOUNTER — Inpatient Hospital Stay

## 2024-10-31 ENCOUNTER — Inpatient Hospital Stay: Attending: Oncology | Admitting: Oncology

## 2024-10-31 VITALS — BP 136/81 | HR 61 | Temp 98.5°F | Resp 16 | Ht 69.0 in | Wt 253.0 lb

## 2024-10-31 DIAGNOSIS — Z79899 Other long term (current) drug therapy: Secondary | ICD-10-CM | POA: Diagnosis not present

## 2024-10-31 DIAGNOSIS — C159 Malignant neoplasm of esophagus, unspecified: Secondary | ICD-10-CM | POA: Insufficient documentation

## 2024-10-31 DIAGNOSIS — C155 Malignant neoplasm of lower third of esophagus: Secondary | ICD-10-CM | POA: Diagnosis not present

## 2024-10-31 DIAGNOSIS — Z5111 Encounter for antineoplastic chemotherapy: Secondary | ICD-10-CM | POA: Diagnosis present

## 2024-10-31 LAB — CBC WITH DIFFERENTIAL (CANCER CENTER ONLY)
Abs Immature Granulocytes: 0.65 K/uL — ABNORMAL HIGH (ref 0.00–0.07)
Basophils Absolute: 0.1 K/uL (ref 0.0–0.1)
Basophils Relative: 1 %
Eosinophils Absolute: 0.1 K/uL (ref 0.0–0.5)
Eosinophils Relative: 1 %
HCT: 36.5 % — ABNORMAL LOW (ref 39.0–52.0)
Hemoglobin: 11.5 g/dL — ABNORMAL LOW (ref 13.0–17.0)
Immature Granulocytes: 5 %
Lymphocytes Relative: 20 %
Lymphs Abs: 2.6 K/uL (ref 0.7–4.0)
MCH: 24.4 pg — ABNORMAL LOW (ref 26.0–34.0)
MCHC: 31.5 g/dL (ref 30.0–36.0)
MCV: 77.3 fL — ABNORMAL LOW (ref 80.0–100.0)
Monocytes Absolute: 2.3 K/uL — ABNORMAL HIGH (ref 0.1–1.0)
Monocytes Relative: 18 %
Neutro Abs: 7 K/uL (ref 1.7–7.7)
Neutrophils Relative %: 55 %
Platelet Count: 324 K/uL (ref 150–400)
RBC: 4.72 MIL/uL (ref 4.22–5.81)
RDW: 17.2 % — ABNORMAL HIGH (ref 11.5–15.5)
Smear Review: NORMAL
WBC Count: 12.7 K/uL — ABNORMAL HIGH (ref 4.0–10.5)
nRBC: 0.2 % (ref 0.0–0.2)

## 2024-10-31 LAB — CMP (CANCER CENTER ONLY)
ALT: 31 U/L (ref 0–44)
AST: 27 U/L (ref 15–41)
Albumin: 3.7 g/dL (ref 3.5–5.0)
Alkaline Phosphatase: 137 U/L — ABNORMAL HIGH (ref 38–126)
Anion gap: 12 (ref 5–15)
BUN: 10 mg/dL (ref 8–23)
CO2: 22 mmol/L (ref 22–32)
Calcium: 8.9 mg/dL (ref 8.9–10.3)
Chloride: 106 mmol/L (ref 98–111)
Creatinine: 0.95 mg/dL (ref 0.61–1.24)
GFR, Estimated: >60 mL/min (ref >60)
Glucose, Bld: 83 mg/dL (ref 70–99)
Potassium: 3.8 mmol/L (ref 3.5–5.1)
Sodium: 140 mmol/L (ref 135–145)
Total Bilirubin: 0.4 mg/dL (ref 0.0–1.2)
Total Protein: 6.3 g/dL — ABNORMAL LOW (ref 6.5–8.1)

## 2024-11-01 ENCOUNTER — Encounter: Payer: Self-pay | Admitting: Oncology

## 2024-11-02 ENCOUNTER — Encounter: Payer: Self-pay | Admitting: Oncology

## 2024-11-02 ENCOUNTER — Other Ambulatory Visit: Payer: Self-pay

## 2024-11-03 ENCOUNTER — Encounter: Payer: Self-pay | Admitting: Oncology

## 2024-11-04 ENCOUNTER — Encounter: Payer: Self-pay | Admitting: Oncology

## 2024-11-04 ENCOUNTER — Other Ambulatory Visit: Payer: Self-pay | Admitting: Oncology

## 2024-11-04 DIAGNOSIS — C155 Malignant neoplasm of lower third of esophagus: Secondary | ICD-10-CM

## 2024-11-07 ENCOUNTER — Inpatient Hospital Stay

## 2024-11-07 ENCOUNTER — Encounter: Payer: Self-pay | Admitting: Oncology

## 2024-11-07 ENCOUNTER — Other Ambulatory Visit (HOSPITAL_BASED_OUTPATIENT_CLINIC_OR_DEPARTMENT_OTHER): Payer: Self-pay

## 2024-11-07 VITALS — BP 138/84 | HR 75 | Temp 98.4°F | Resp 18

## 2024-11-07 VITALS — BP 128/61 | HR 92 | Temp 98.4°F | Resp 18 | Ht 69.0 in | Wt 255.0 lb

## 2024-11-07 DIAGNOSIS — C155 Malignant neoplasm of lower third of esophagus: Secondary | ICD-10-CM

## 2024-11-07 DIAGNOSIS — Z5111 Encounter for antineoplastic chemotherapy: Secondary | ICD-10-CM | POA: Diagnosis not present

## 2024-11-07 LAB — CBC WITH DIFFERENTIAL (CANCER CENTER ONLY)
Abs Immature Granulocytes: 1.22 K/uL — ABNORMAL HIGH (ref 0.00–0.07)
Basophils Absolute: 0.1 K/uL (ref 0.0–0.1)
Basophils Relative: 0 %
Eosinophils Absolute: 0 K/uL (ref 0.0–0.5)
Eosinophils Relative: 0 %
HCT: 35.1 % — ABNORMAL LOW (ref 39.0–52.0)
Hemoglobin: 11.3 g/dL — ABNORMAL LOW (ref 13.0–17.0)
Immature Granulocytes: 5 %
Lymphocytes Relative: 12 %
Lymphs Abs: 2.6 K/uL (ref 0.7–4.0)
MCH: 24.8 pg — ABNORMAL LOW (ref 26.0–34.0)
MCHC: 32.2 g/dL (ref 30.0–36.0)
MCV: 77 fL — ABNORMAL LOW (ref 80.0–100.0)
Monocytes Absolute: 0.5 K/uL (ref 0.1–1.0)
Monocytes Relative: 2 %
Neutro Abs: 18.5 K/uL — ABNORMAL HIGH (ref 1.7–7.7)
Neutrophils Relative %: 81 %
Platelet Count: 248 K/uL (ref 150–400)
RBC: 4.56 MIL/uL (ref 4.22–5.81)
RDW: 18.5 % — ABNORMAL HIGH (ref 11.5–15.5)
WBC Count: 23 K/uL — ABNORMAL HIGH (ref 4.0–10.5)
nRBC: 0 % (ref 0.0–0.2)

## 2024-11-07 LAB — COMPREHENSIVE METABOLIC PANEL WITH GFR
ALT: 17 U/L (ref 0–44)
AST: 17 U/L (ref 15–41)
Albumin: 3.6 g/dL (ref 3.5–5.0)
Alkaline Phosphatase: 129 U/L — ABNORMAL HIGH (ref 38–126)
Anion gap: 9 (ref 5–15)
BUN: 12 mg/dL (ref 8–23)
CO2: 25 mmol/L (ref 22–32)
Calcium: 9.4 mg/dL (ref 8.9–10.3)
Chloride: 106 mmol/L (ref 98–111)
Creatinine, Ser: 0.88 mg/dL (ref 0.61–1.24)
GFR, Estimated: >60 mL/min (ref >60)
Glucose, Bld: 144 mg/dL — ABNORMAL HIGH (ref 70–99)
Potassium: 3.7 mmol/L (ref 3.5–5.1)
Sodium: 140 mmol/L (ref 135–145)
Total Bilirubin: 0.3 mg/dL (ref 0.0–1.2)
Total Protein: 6.4 g/dL — ABNORMAL LOW (ref 6.5–8.1)

## 2024-11-07 LAB — TSH: TSH: 0.902 u[IU]/mL (ref 0.350–4.500)

## 2024-11-07 MED ORDER — SODIUM CHLORIDE 0.9 % IV SOLN
150.0000 mg | Freq: Once | INTRAVENOUS | Status: AC
  Administered 2024-11-07: 150 mg via INTRAVENOUS
  Filled 2024-11-07: qty 150

## 2024-11-07 MED ORDER — PROCHLORPERAZINE MALEATE 10 MG PO TABS
10.0000 mg | ORAL_TABLET | Freq: Four times a day (QID) | ORAL | 1 refills | Status: AC | PRN
  Filled 2024-11-07: qty 30, 8d supply, fill #0

## 2024-11-07 MED ORDER — FAMOTIDINE IN NACL 20-0.9 MG/50ML-% IV SOLN
20.0000 mg | Freq: Once | INTRAVENOUS | Status: AC
  Administered 2024-11-07: 20 mg via INTRAVENOUS

## 2024-11-07 MED ORDER — DIPHENHYDRAMINE HCL 50 MG/ML IJ SOLN
25.0000 mg | Freq: Once | INTRAMUSCULAR | Status: AC
  Administered 2024-11-07: 25 mg via INTRAVENOUS

## 2024-11-07 MED ORDER — LEUCOVORIN CALCIUM INJECTION 350 MG
200.0000 mg/m2 | Freq: Once | INTRAVENOUS | Status: AC
  Administered 2024-11-07: 476 mg via INTRAVENOUS
  Filled 2024-11-07: qty 23.8

## 2024-11-07 MED ORDER — SODIUM CHLORIDE 0.9 % IV SOLN: INTRAVENOUS | Status: DC

## 2024-11-07 MED ORDER — SODIUM CHLORIDE 0.9 % IV SOLN
1500.0000 mg | Freq: Once | INTRAVENOUS | Status: AC
  Administered 2024-11-07: 1500 mg via INTRAVENOUS
  Filled 2024-11-07: qty 30

## 2024-11-07 MED ORDER — DEXTROSE 5 % IV SOLN: INTRAVENOUS | Status: DC

## 2024-11-07 MED ORDER — OXALIPLATIN CHEMO INJECTION 100 MG/20ML
85.0000 mg/m2 | Freq: Once | INTRAVENOUS | Status: AC
  Administered 2024-11-07: 200 mg via INTRAVENOUS
  Filled 2024-11-07: qty 40

## 2024-11-07 MED ORDER — PALONOSETRON HCL INJECTION 0.25 MG/5ML
0.2500 mg | Freq: Once | INTRAVENOUS | Status: AC
  Administered 2024-11-07: 0.25 mg via INTRAVENOUS

## 2024-11-07 MED ORDER — SODIUM CHLORIDE 0.9 % IV SOLN
2600.0000 mg/m2 | INTRAVENOUS | Status: DC
  Administered 2024-11-07: 6200 mg via INTRAVENOUS
  Filled 2024-11-07 (×2): qty 124

## 2024-11-07 MED ORDER — ONDANSETRON HCL 8 MG PO TABS
8.0000 mg | ORAL_TABLET | Freq: Three times a day (TID) | ORAL | 1 refills | Status: AC | PRN
Start: 2024-11-07 — End: ?
  Filled 2024-11-07: qty 30, 10d supply, fill #0

## 2024-11-07 MED ORDER — DEXAMETHASONE SOD PHOSPHATE PF 10 MG/ML IJ SOLN
10.0000 mg | Freq: Once | INTRAMUSCULAR | Status: AC
  Administered 2024-11-07: 10 mg via INTRAVENOUS

## 2024-11-07 MED ORDER — SODIUM CHLORIDE 0.9 % IV SOLN
50.0000 mg/m2 | Freq: Once | INTRAVENOUS | Status: AC
  Administered 2024-11-07: 119 mg via INTRAVENOUS
  Filled 2024-11-07: qty 11.9

## 2024-11-07 NOTE — Patient Instructions (Signed)
 Fluorouracil Injection What is this medication? FLUOROURACIL (flure oh YOOR a sil) treats some types of cancer. It works by slowing down the growth of cancer cells. This medicine may be used for other purposes; ask your health care provider or pharmacist if you have questions. COMMON BRAND NAME(S): Adrucil What should I tell my care team before I take this medication? They need to know if you have any of these conditions: Blood disorders Dihydropyrimidine dehydrogenase (DPD) deficiency Infection, such as chickenpox, cold sores, herpes Kidney disease Liver disease Poor nutrition Recent or ongoing radiation therapy An unusual or allergic reaction to fluorouracil, other medications, foods, dyes, or preservatives If you or your partner are pregnant or trying to get pregnant Breast-feeding How should I use this medication? This medication is injected into a vein. It is administered by your care team in a hospital or clinic setting. Talk to your care team about the use of this medication in children. Special care may be needed. Overdosage: If you think you have taken too much of this medicine contact a poison control center or emergency room at once. NOTE: This medicine is only for you. Do not share this medicine with others. What if I miss a dose? Keep appointments for follow-up doses. It is important not to miss your dose. Call your care team if you are unable to keep an appointment. What may interact with this medication? Do not take this medication with any of the following: Live virus vaccines This medication may also interact with the following: Medications that treat or prevent blood clots, such as warfarin, enoxaparin , dalteparin This list may not describe all possible interactions. Give your health care provider a list of all the medicines, herbs, non-prescription drugs, or dietary supplements you use. Also tell them if you smoke, drink alcohol, or use illegal drugs. Some items may  interact with your medicine. What should I watch for while using this medication? Your condition will be monitored carefully while you are receiving this medication. This medication may make you feel generally unwell. This is not uncommon as chemotherapy can affect healthy cells as well as cancer cells. Report any side effects. Continue your course of treatment even though you feel ill unless your care team tells you to stop. In some cases, you may be given additional medications to help with side effects. Follow all directions for their use. This medication may increase your risk of getting an infection. Call your care team for advice if you get a fever, chills, sore throat, or other symptoms of a cold or flu. Do not treat yourself. Try to avoid being around people who are sick. This medication may increase your risk to bruise or bleed. Call your care team if you notice any unusual bleeding. Be careful brushing or flossing your teeth or using a toothpick because you may get an infection or bleed more easily. If you have any dental work done, tell your dentist you are receiving this medication. Avoid taking medications that contain aspirin, acetaminophen , ibuprofen, naproxen, or ketoprofen unless instructed by your care team. These medications may hide a fever. Do not treat diarrhea with over the counter products. Contact your care team if you have diarrhea that lasts more than 2 days or if it is severe and watery. This medication can make you more sensitive to the sun. Keep out of the sun. If you cannot avoid being in the sun, wear protective clothing and sunscreen. Do not use sun lamps, tanning beds, or tanning booths. Talk to  your care team if you or your partner wish to become pregnant or think you might be pregnant. This medication can cause serious birth defects if taken during pregnancy and for 3 months after the last dose. A reliable form of contraception is recommended while taking this  medication and for 3 months after the last dose. Talk to your care team about effective forms of contraception. Do not father a child while taking this medication and for 3 months after the last dose. Use a condom while having sex during this time period. Do not breastfeed while taking this medication. This medication may cause infertility. Talk to your care team if you are concerned about your fertility. What side effects may I notice from receiving this medication? Side effects that you should report to your care team as soon as possible: Allergic reactions--skin rash, itching, hives, swelling of the face, lips, tongue, or throat Heart attack--pain or tightness in the chest, shoulders, arms, or jaw, nausea, shortness of breath, cold or clammy skin, feeling faint or lightheaded Heart failure--shortness of breath, swelling of the ankles, feet, or hands, sudden weight gain, unusual weakness or fatigue Heart rhythm changes--fast or irregular heartbeat, dizziness, feeling faint or lightheaded, chest pain, trouble breathing High ammonia level--unusual weakness or fatigue, confusion, loss of appetite, nausea, vomiting, seizures Infection--fever, chills, cough, sore throat, wounds that don't heal, pain or trouble when passing urine, general feeling of discomfort or being unwell Low red blood cell level--unusual weakness or fatigue, dizziness, headache, trouble breathing Pain, tingling, or numbness in the hands or feet, muscle weakness, change in vision, confusion or trouble speaking, loss of balance or coordination, trouble walking, seizures Redness, swelling, and blistering of the skin over hands and feet Severe or prolonged diarrhea Unusual bruising or bleeding Side effects that usually do not require medical attention (report to your care team if they continue or are bothersome): Dry skin Headache Increased tears Nausea Pain, redness, or swelling with sores inside the mouth or throat Sensitivity  to light Vomiting This list may not describe all possible side effects. Call your doctor for medical advice about side effects. You may report side effects to FDA at 1-800-FDA-1088. Where should I keep my medication? This medication is given in a hospital or clinic. It will not be stored at home. NOTE: This sheet is a summary. It may not cover all possible information. If you have questions about this medicine, talk to your doctor, pharmacist, or health care provider.  2024 Else[employer]/Gold Standard (2022-04-15 00:00:00)Leucovorin Injection What is this medication? LEUCOVORIN (loo koe VOR in) prevents side effects from certain medications, such as methotrexate. It works by increasing folate levels. This helps protect healthy cells in your body. It may also be used to treat anemia caused by low levels of folate. It can also be used with fluorouracil, a type of chemotherapy, to treat colorectal cancer. It works by increasing the effects of fluorouracil in the body. This medicine may be used for other purposes; ask your health care provider or pharmacist if you have questions. What should I tell my care team before I take this medication? They need to know if you have any of these conditions: Anemia from low levels of vitamin B12 in the blood An unusual or allergic reaction to leucovorin, folic acid, other medications, foods, dyes, or preservatives Pregnant or trying to get pregnant Breastfeeding How should I use this medication? This medication is injected into a vein or a muscle. It is given by your care team in  a hospital or clinic setting. Talk to your care team about the use of this medication in children. Special care may be needed. Overdosage: If you think you have taken too much of this medicine contact a poison control center or emergency room at once. NOTE: This medicine is only for you. Do not share this medicine with others. What if I miss a dose? Keep appointments for follow-up doses.  It is important not to miss your dose. Call your care team if you are unable to keep an appointment. What may interact with this medication? Capecitabine Fluorouracil Phenobarbital Phenytoin Primidone Trimethoprim;sulfamethoxazole This list may not describe all possible interactions. Give your health care provider a list of all the medicines, herbs, non-prescription drugs, or dietary supplements you use. Also tell them if you smoke, drink alcohol, or use illegal drugs. Some items may interact with your medicine. What should I watch for while using this medication? Your condition will be monitored carefully while you are receiving this medication. This medication may increase the side effects of 5-fluorouracil. Tell your care team if you have diarrhea or mouth sores that do not get better or that get worse. What side effects may I notice from receiving this medication? Side effects that you should report to your care team as soon as possible: Allergic reactions--skin rash, itching, hives, swelling of the face, lips, tongue, or throat This list may not describe all possible side effects. Call your doctor for medical advice about side effects. You may report side effects to FDA at 1-800-FDA-1088. Where should I keep my medication? This medication is given in a hospital or clinic. It will not be stored at home. NOTE: This sheet is a summary. It may not cover all possible information. If you have questions about this medicine, talk to your doctor, pharmacist, or health care provider.  2024 Else[employer]/Gold Standard (2022-05-13 00:00:00)Oxaliplatin Injection What is this medication? OXALIPLATIN (ox AL i PLA tin) treats colorectal cancer. It works by slowing down the growth of cancer cells. This medicine may be used for other purposes; ask your health care provider or pharmacist if you have questions. COMMON BRAND NAME(S): Eloxatin What should I tell my care team before I take this medication? They  need to know if you have any of these conditions: Heart disease History of irregular heartbeat or rhythm Liver disease Low blood cell levels (white cells, red cells, and platelets) Lung or breathing disease, such as asthma Take medications that treat or prevent blood clots Tingling of the fingers, toes, or other nerve disorder An unusual or allergic reaction to oxaliplatin, other medications, foods, dyes, or preservatives If you or your partner are pregnant or trying to get pregnant Breast-feeding How should I use this medication? This medication is injected into a vein. It is given by your care team in a hospital or clinic setting. Talk to your care team about the use of this medication in children. Special care may be needed. Overdosage: If you think you have taken too much of this medicine contact a poison control center or emergency room at once. NOTE: This medicine is only for you. Do not share this medicine with others. What if I miss a dose? Keep appointments for follow-up doses. It is important not to miss a dose. Call your care team if you are unable to keep an appointment. What may interact with this medication? Do not take this medication with any of the following: Cisapride Dronedarone Pimozide Thioridazine This medication may also interact with the following:  Aspirin and aspirin-like medications Certain medications that treat or prevent blood clots, such as warfarin, apixaban, dabigatran, and rivaroxaban Cisplatin Cyclosporine Diuretics Medications for infection, such as acyclovir, adefovir, amphotericin B, bacitracin, cidofovir, foscarnet, ganciclovir, gentamicin, pentamidine, vancomycin NSAIDs, medications for pain and inflammation, such as ibuprofen or naproxen Other medications that cause heart rhythm changes Pamidronate Zoledronic acid This list may not describe all possible interactions. Give your health care provider a list of all the medicines, herbs,  non-prescription drugs, or dietary supplements you use. Also tell them if you smoke, drink alcohol, or use illegal drugs. Some items may interact with your medicine. What should I watch for while using this medication? Your condition will be monitored carefully while you are receiving this medication. You may need blood work while taking this medication. This medication may make you feel generally unwell. This is not uncommon as chemotherapy can affect healthy cells as well as cancer cells. Report any side effects. Continue your course of treatment even though you feel ill unless your care team tells you to stop. This medication may increase your risk of getting an infection. Call your care team for advice if you get a fever, chills, sore throat, or other symptoms of a cold or flu. Do not treat yourself. Try to avoid being around people who are sick. Avoid taking medications that contain aspirin, acetaminophen , ibuprofen, naproxen, or ketoprofen unless instructed by your care team. These medications may hide a fever. Be careful brushing or flossing your teeth or using a toothpick because you may get an infection or bleed more easily. If you have any dental work done, tell your dentist you are receiving this medication. This medication can make you more sensitive to cold. Do not drink cold drinks or use ice. Cover exposed skin before coming in contact with cold temperatures or cold objects. When out in cold weather wear warm clothing and cover your mouth and nose to warm the air that goes into your lungs. Tell your care team if you get sensitive to the cold. Talk to your care team if you or your partner are pregnant or think either of you might be pregnant. This medication can cause serious birth defects if taken during pregnancy and for 9 months after the last dose. A negative pregnancy test is required before starting this medication. A reliable form of contraception is recommended while taking this  medication and for 9 months after the last dose. Talk to your care team about effective forms of contraception. Do not father a child while taking this medication and for 6 months after the last dose. Use a condom while having sex during this time period. Do not breastfeed while taking this medication and for 3 months after the last dose. This medication may cause infertility. Talk to your care team if you are concerned about your fertility. What side effects may I notice from receiving this medication? Side effects that you should report to your care team as soon as possible: Allergic reactions--skin rash, itching, hives, swelling of the face, lips, tongue, or throat Bleeding--bloody or black, tar-like stools, vomiting blood or brown material that looks like coffee grounds, red or dark brown urine, small red or purple spots on skin, unusual bruising or bleeding Dry cough, shortness of breath or trouble breathing Heart rhythm changes--fast or irregular heartbeat, dizziness, feeling faint or lightheaded, chest pain, trouble breathing Infection--fever, chills, cough, sore throat, wounds that don't heal, pain or trouble when passing urine, general feeling of discomfort or being unwell  Liver injury--right upper belly pain, loss of appetite, nausea, light-colored stool, dark yellow or brown urine, yellowing skin or eyes, unusual weakness or fatigue Low red blood cell level--unusual weakness or fatigue, dizziness, headache, trouble breathing Muscle injury--unusual weakness or fatigue, muscle pain, dark yellow or brown urine, decrease in amount of urine Pain, tingling, or numbness in the hands or feet Sudden and severe headache, confusion, change in vision, seizures, which may be signs of posterior reversible encephalopathy syndrome (PRES) Unusual bruising or bleeding Side effects that usually do not require medical attention (report to your care team if they continue or are  bothersome): Diarrhea Nausea Pain, redness, or swelling with sores inside the mouth or throat Unusual weakness or fatigue Vomiting This list may not describe all possible side effects. Call your doctor for medical advice about side effects. You may report side effects to FDA at 1-800-FDA-1088. Where should I keep my medication? This medication is given in a hospital or clinic. It will not be stored at home. NOTE: This sheet is a summary. It may not cover all possible information. If you have questions about this medicine, talk to your doctor, pharmacist, or health care provider.  2024 Elsevier/Gold Standard (2023-11-20 00:00:00)Docetaxel Injection What is this medication? DOCETAXEL (doe se TAX el) treats some types of cancer. It works by slowing down the growth of cancer cells. This medicine may be used for other purposes; ask your health care provider or pharmacist if you have questions. COMMON BRAND NAME(S): BEIZRAY, Docefrez, Docivyx, Taxotere What should I tell my care team before I take this medication? They need to know if you have any of these conditions: Kidney disease Liver disease Low white blood cell levels Tingling of the fingers or toes or other nerve disorder An unusual or allergic reaction to docetaxel, polysorbate 80, other medications, foods, dyes, or preservatives Pregnant or trying to get pregnant Breast-feeding How should I use this medication? This medication is injected into a vein. It is given by your care team in a hospital or clinic setting. Talk to your care team about the use of this medication in children. Special care may be needed. Overdosage: If you think you have taken too much of this medicine contact a poison control center or emergency room at once. NOTE: This medicine is only for you. Do not share this medicine with others. What if I miss a dose? Keep appointments for follow-up doses. It is important not to miss your dose. Call your care team if you  are unable to keep an appointment. What may interact with this medication? Do not take this medication with any of the following: Live virus vaccines This medication may also interact with the following: Certain antibiotics, such as clarithromycin, telithromycin Certain antivirals for HIV or hepatitis Certain medications for fungal infections, such as itraconazole, ketoconazole, voriconazole Grapefruit juice Nefazodone Supplements, such as St. John's wort This list may not describe all possible interactions. Give your health care provider a list of all the medicines, herbs, non-prescription drugs, or dietary supplements you use. Also tell them if you smoke, drink alcohol, or use illegal drugs. Some items may interact with your medicine. What should I watch for while using this medication? This medication may make you feel generally unwell. This is not uncommon as chemotherapy can affect healthy cells as well as cancer cells. Report any side effects. Continue your course of treatment even though you feel ill unless your care team tells you to stop. You may need blood work done while  you are taking this medication. This medication can cause serious side effects and infusion reactions. To reduce the risk, your care team may give you other medications to take before receiving this one. Be sure to follow the directions from your care team. This medication may increase your risk of getting an infection. Call your care team for advice if you get a fever, chills, sore throat, or other symptoms of a cold or flu. Do not treat yourself. Try to avoid being around people who are sick. Avoid taking medications that contain aspirin, acetaminophen , ibuprofen, naproxen, or ketoprofen unless instructed by your care team. These medications may hide a fever. Be careful brushing or flossing your teeth or using a toothpick because you may get an infection or bleed more easily. If you have any dental work done, tell your  dentist you are receiving this medication. Some products may contain alcohol. Ask your care team if this medication contains alcohol. Be sure to tell all care teams you are taking this medicine. Certain medications, like metronidazole and disulfiram, can cause an unpleasant reaction when taken with alcohol. The reaction includes flushing, headache, nausea, vomiting, sweating, and increased thirst. The reaction can last from 30 minutes to several hours. This medication may affect your coordination, reaction time, or judgement. Do not drive or operate machinery until you know how this medication affects you. Sit up or stand slowly to reduce the risk of dizzy or fainting spells. Drinking alcohol with this medication can increase the risk of these side effects. Talk to your care team about your risk of cancer. You may be more at risk for certain types of cancer if you take this medication. Talk to your care team if you wish to become pregnant or think you might be pregnant. This medication can cause serious birth defects if taken during pregnancy or if you get pregnant within 2 months after stopping therapy. A negative pregnancy test is required before starting this medication. A reliable form of contraception is recommended while taking this medication and for 2 months after stopping it. Talk to your care team about reliable forms of contraception. Do not breast-feed while taking this medication and for 1 week after stopping therapy. Use a condom during sex and for 4 months after stopping therapy. Tell your care team right away if you think your partner might be pregnant. This medication can cause serious birth defects. This medication may cause infertility. Talk to your care team if you are concerned about your fertility. What side effects may I notice from receiving this medication? Side effects that you should report to your care team as soon as possible: Allergic reactions--skin rash, itching, hives,  swelling of the face, lips, tongue, or throat Change in vision such as blurry vision, seeing halos around lights, vision loss Infection--fever, chills, cough, or sore throat Infusion reactions--chest pain, shortness of breath or trouble breathing, feeling faint or lightheaded Low red blood cell level--unusual weakness or fatigue, dizziness, headache, trouble breathing Pain, tingling, or numbness in the hands or feet Painful swelling, warmth, or redness of the skin, blisters or sores at the infusion site Redness, blistering, peeling, or loosening of the skin, including inside the mouth Sudden or severe stomach pain, bloody diarrhea, fever, nausea, vomiting Swelling of the ankles, hands, or feet Tumor lysis syndrome (TLS)--nausea, vomiting, diarrhea, decrease in the amount of urine, dark urine, unusual weakness or fatigue, confusion, muscle pain or cramps, fast or irregular heartbeat, joint pain Unusual bruising or bleeding Side effects that usually  do not require medical attention (report to your care team if they continue or are bothersome): Change in nail shape, thickness, or color Change in taste Hair loss Increased tears This list may not describe all possible side effects. Call your doctor for medical advice about side effects. You may report side effects to FDA at 1-800-FDA-1088. Where should I keep my medication? This medication is given in a hospital or clinic. It will not be stored at home. NOTE: This sheet is a summary. It may not cover all possible information. If you have questions about this medicine, talk to your doctor, pharmacist, or health care provider.  2024 Else[employer]/Gold Standard (2022-02-13 00:00:00)Durvalumab Injection What is this medication? DURVALUMAB (dur VAL ue mab) treats some types of cancer. It works by helping your immune system slow or stop the spread of cancer cells. It is a monoclonal antibody. This medicine may be used for other purposes; ask your health  care provider or pharmacist if you have questions. COMMON BRAND NAME(S): IMFINZI What should I tell my care team before I take this medication? They need to know if you have any of these conditions: Allogeneic stem cell transplant (uses someone else's stem cells) Autoimmune diseases, such as Crohn disease, ulcerative colitis, lupus History of chest radiation Nervous system problems, such as Guillain-Barre syndrome, myasthenia gravis Organ transplant An unusual or allergic reaction to durvalumab, other medications, foods, dyes, or preservatives Pregnant or trying to get pregnant Breast-feeding How should I use this medication? This medication is infused into a vein. It is given by your care team in a hospital or clinic setting. A special MedGuide will be given to you before each treatment. Be sure to read this information carefully each time. Talk to your care team about the use of this medication in children. Special care may be needed. Overdosage: If you think you have taken too much of this medicine contact a poison control center or emergency room at once. NOTE: This medicine is only for you. Do not share this medicine with others. What if I miss a dose? Keep appointments for follow-up doses. It is important not to miss your dose. Call your care team if you are unable to keep an appointment. What may interact with this medication? Interactions have not been studied. This list may not describe all possible interactions. Give your health care provider a list of all the medicines, herbs, non-prescription drugs, or dietary supplements you use. Also tell them if you smoke, drink alcohol, or use illegal drugs. Some items may interact with your medicine. What should I watch for while using this medication? Your condition will be monitored carefully while you are receiving this medication. You may need blood work while taking this medication. This medication may cause serious skin reactions. They  can happen weeks to months after starting the medication. Contact your care team right away if you notice fevers or flu-like symptoms with a rash. The rash may be red or purple and then turn into blisters or peeling of the skin. You may also notice a red rash with swelling of the face, lips, or lymph nodes in your neck or under your arms. Tell your care team right away if you have any change in your eyesight. Talk to your care team if you may be pregnant. Serious birth defects can occur if you take this medication during pregnancy and for 3 months after the last dose. You will need a negative pregnancy test before starting this medication. Contraception is recommended while  taking this medication and for 3 months after the last dose. Your care team can help you find the option that works for you. Do not breastfeed while taking this medication and for 3 months after the last dose. What side effects may I notice from receiving this medication? Side effects that you should report to your care team as soon as possible: Allergic reactions--skin rash, itching, hives, swelling of the face, lips, tongue, or throat Dry cough, shortness of breath or trouble breathing Eye pain, redness, irritation, or discharge with blurry or decreased vision Heart muscle inflammation--unusual weakness or fatigue, shortness of breath, chest pain, fast or irregular heartbeat, dizziness, swelling of the ankles, feet, or hands Hormone gland problems--headache, sensitivity to light, unusual weakness or fatigue, dizziness, fast or irregular heartbeat, increased sensitivity to cold or heat, excessive sweating, constipation, hair loss, increased thirst or amount of urine, tremors or shaking, irritability Infusion reactions--chest pain, shortness of breath or trouble breathing, feeling faint or lightheaded Kidney injury (glomerulonephritis)--decrease in the amount of urine, red or dark brown urine, foamy or bubbly urine, swelling of the  ankles, hands, or feet Liver injury--right upper belly pain, loss of appetite, nausea, light-colored stool, dark yellow or brown urine, yellowing skin or eyes, unusual weakness or fatigue Pain, tingling, or numbness in the hands or feet, muscle weakness, change in vision, confusion or trouble speaking, loss of balance or coordination, trouble walking, seizures Rash, fever, and swollen lymph nodes Redness, blistering, peeling, or loosening of the skin, including inside the mouth Sudden or severe stomach pain, bloody diarrhea, fever, nausea, vomiting Side effects that usually do not require medical attention (report these to your care team if they continue or are bothersome): Bone, joint, or muscle pain Diarrhea Fatigue Loss of appetite Nausea Skin rash This list may not describe all possible side effects. Call your doctor for medical advice about side effects. You may report side effects to FDA at 1-800-FDA-1088. Where should I keep my medication? This medication is given in a hospital or clinic. It will not be stored at home. NOTE: This sheet is a summary. It may not cover all possible information. If you have questions about this medicine, talk to your doctor, pharmacist, or health care provider.  2024 Elsevier/Gold Standard (2022-04-22 00:00:00)

## 2024-11-07 NOTE — Progress Notes (Signed)
..  Pharmacist Chemotherapy Monitoring - Initial Assessment    Anticipated start date: 11/07/2024  Patient received first cycle at [facility] on 10/24/24.  Dr. 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  Insurance has approved.  The following has been reviewed per standard work regarding the patient's treatment regimen: The patient's diagnosis, treatment plan and drug doses, and organ/hematologic function Lab orders and baseline tests specific to treatment regimen  The treatment plan start date, drug sequencing, and pre-medications Prior authorization status  Patient's documented medication list, including drug-drug interaction screen and prescriptions for anti-emetics and supportive care specific to the treatment regimen The drug concentrations, fluid compatibility, administration routes, and timing of the medications to be used The patient's access for treatment and lifetime cumulative dose history, if applicable  The patient's medication allergies and previous infusion related reactions, if applicable   Changes made to treatment plan:  N/A  Follow up needed:  Checking on home antiemetics   Devere CHRISTELLA Manzanilla, RPH, 11/07/2024  10:03 AM

## 2024-11-07 NOTE — Patient Instructions (Signed)
 [facility] - A DEPT OF [facility]  Discharge Instructions: Thank you for choosing [facility] to provide your oncology and hematology care.  If you have a lab appointment with the Cancer Center, please go directly to the Cancer Center and check in at the registration area.   Wear comfortable clothing and clothing appropriate for easy access to any Portacath or PICC line.   We strive to give you quality time with your provider. You may need to reschedule your appointment if you arrive late (15 or more minutes).  Arriving late affects you and other patients whose appointments are after yours.  Also, if you miss three or more appointments without notifying the office, you may be dismissed from the clinic at the provider's discretion.      For prescription refill requests, have your pharmacy contact our office and allow 72 hours for refills to be completed.    Today you received the following chemotherapy and/or immunotherapy agents FLOT   To help prevent nausea and vomiting after your treatment, we encourage you to take your nausea medication as directed.  BELOW ARE SYMPTOMS THAT SHOULD BE REPORTED IMMEDIATELY: *FEVER GREATER THAN 100.4 F (38 C) OR HIGHER *CHILLS OR SWEATING *NAUSEA AND VOMITING THAT IS NOT CONTROLLED WITH YOUR NAUSEA MEDICATION *UNUSUAL SHORTNESS OF BREATH *UNUSUAL BRUISING OR BLEEDING *URINARY PROBLEMS (pain or burning when urinating, or frequent urination) *BOWEL PROBLEMS (unusual diarrhea, constipation, pain near the anus) TENDERNESS IN MOUTH AND THROAT WITH OR WITHOUT PRESENCE OF ULCERS (sore throat, sores in mouth, or a toothache) UNUSUAL RASH, SWELLING OR PAIN  UNUSUAL VAGINAL DISCHARGE OR ITCHING   Items with * indicate a potential emergency and should be followed up as soon as possible or go to the Emergency Department if any problems should occur.  Please show the CHEMOTHERAPY ALERT CARD or IMMUNOTHERAPY ALERT CARD at  check-in to the Emergency Department and triage nurse.  Should you have questions after your visit or need to cancel or reschedule your appointment, please contact [facility] [place] - A DEPT OF [facility]  Dept: [phone]  and follow the prompts.  Office hours are 8:00 a.m. to 4:30 p.m. Monday - Friday. Please note that voicemails left after 4:00 p.m. may not be returned until the following business day.  We are closed weekends and major holidays. You have access to a nurse at all times for urgent questions. Please call the main number to the clinic Dept: [phone] and follow the prompts.  For any non-urgent questions, you may also contact your provider using MyChart. We now offer e-Visits for anyone 45 and older to request care online for non-urgent symptoms. For details visit [email or web address].   Also download the MyChart app! Go to the app store, search MyChart, open the app, select [facility], and log in with your MyChart username and password.

## 2024-11-08 ENCOUNTER — Inpatient Hospital Stay

## 2024-11-08 ENCOUNTER — Telehealth: Payer: Self-pay

## 2024-11-08 LAB — T4: T4, Total: 7.5 ug/dL (ref 4.5–12.0)

## 2024-11-08 NOTE — Telephone Encounter (Signed)
 Pt states, I feel pretty good. He has tried to stay ahead of the nausea- meds working. He mentions he hasn't been drinking any cold beverages. Afebrile. I reminded pt to start Claritin in am and continue for 5 days to help prevent body/joint aches that he may develop from his WBC injection he will get tomorrow. I also told him he may eventually develop mouth sores/tenderness - he will call if this happens so we can get him some MMW. I reminded him of importance of calling us  if he develops temp of 100.4 or higher, day or night. He verbalized understanding.

## 2024-11-09 ENCOUNTER — Other Ambulatory Visit: Payer: Self-pay | Admitting: Hematology and Oncology

## 2024-11-09 ENCOUNTER — Telehealth: Payer: Self-pay

## 2024-11-09 ENCOUNTER — Inpatient Hospital Stay

## 2024-11-09 VITALS — BP 139/71 | HR 74 | Temp 98.0°F | Resp 18

## 2024-11-09 DIAGNOSIS — Z5111 Encounter for antineoplastic chemotherapy: Secondary | ICD-10-CM | POA: Diagnosis not present

## 2024-11-09 DIAGNOSIS — C155 Malignant neoplasm of lower third of esophagus: Secondary | ICD-10-CM

## 2024-11-09 MED ORDER — MAGIC MOUTHWASH W/LIDOCAINE: 5.0000 mL | Freq: Four times a day (QID) | ORAL | 0 refills | Status: DC | PRN

## 2024-11-09 MED ORDER — PEGFILGRASTIM-CBQV 6 MG/0.6ML ~~LOC~~ SOSY
6.0000 mg | PREFILLED_SYRINGE | Freq: Once | SUBCUTANEOUS | Status: AC
  Administered 2024-11-09: 6 mg via SUBCUTANEOUS

## 2024-11-09 NOTE — Telephone Encounter (Signed)
 [facility] Clinical Social Work  Clinical Social Work was referred by nurse for assessment of psychosocial needs.  Clinical Social Worker contacted patient by phone to offer support and assess for needs.Patient is a new patient of medical provider transferring from [facility]. Patient reported being in postive spirits and denied any SDOH needs. Patient's spouse is a part of his support system. Patient stated general tolerance of treatment. No concerns at this time.   Lizbeth Sprague, LCSW  Clinical Social Worker [facility]

## 2024-11-20 NOTE — Progress Notes (Unsigned)
 [facility] at [facility] [address] [place],  [place]  [address] [phone]  Clinic Day: 11/21/2024  Referring physician: Jackolyn Darice BROCKS, FNP   HISTORY OF PRESENT ILLNESS:  The patient is a 68 y.o. 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  He comes in today to be evaluated before he heads into his 3rd of 4 cycles of neoadjuvant FLOT/Durvalumab  chemotherapy.   The patient claims to have tolerated his 2nd cycle of treatment very well.  The only problem he had was with joint discomfort after receiving Neulasta .  As it pertains to his disease, the patient claims his dysphagia has significantly improved since his neoadjuvant therapy began.  He denies having any new symptoms or findings which concern him for disease progression.   PHYSICAL EXAM:  Blood pressure (!) 140/75, pulse 68, temperature 97.7 F (36.5 C), temperature source Oral, resp. rate 16, height 5' 9 (1.753 m), weight 261 lb (118.4 kg), SpO2 99%. Wt Readings from Last 3 Encounters:  11/21/24 261 lb (118.4 kg)  11/07/24 255 lb (115.7 kg)  10/31/24 253 lb (114.8 kg)   Body mass index is 38.54 kg/m. Performance status (ECOG): 1 - Symptomatic but completely ambulatory Physical Exam Constitutional:      Appearance: Normal appearance. He is obese. He is not ill-appearing.  HENT:     Mouth/Throat:     Mouth: Mucous membranes are moist.     Dentition: Abnormal dentition. Dental caries present.     Pharynx: Oropharynx is clear. No oropharyngeal exudate or posterior oropharyngeal erythema.  Cardiovascular:     Rate and Rhythm: Normal rate and regular rhythm.     Heart sounds: No murmur heard.    No friction rub. No gallop.  Pulmonary:     Effort: Pulmonary effort is normal. No respiratory distress.     Breath sounds: Normal breath sounds. No wheezing, rhonchi or rales.  Abdominal:     General: Bowel sounds are normal. There is no distension.     Palpations: Abdomen is soft. There is no mass.      Tenderness: There is no abdominal tenderness.  Musculoskeletal:        General: No swelling.     Right lower leg: No edema.     Left lower leg: No edema.  Lymphadenopathy:     Cervical: No cervical adenopathy.     Upper Body:     Right upper body: No supraclavicular or axillary adenopathy.     Left upper body: No supraclavicular or axillary adenopathy.     Lower Body: No right inguinal adenopathy. No left inguinal adenopathy.  Skin:    General: Skin is warm.     Coloration: Skin is not jaundiced.     Findings: No lesion or rash.  Neurological:     General: No focal deficit present.     Mental Status: He is alert and oriented to person, place, and time. Mental status is at baseline.  Psychiatric:        Mood and Affect: Mood normal.        Behavior: Behavior normal.        Thought Content: Thought content normal.    LABS:      Latest Ref Rng & Units 11/21/2024    8:59 AM 11/07/2024    9:03 AM 10/31/2024    2:38 PM  CBC  WBC 4.0 - 10.5 K/uL 26.9  23.0  12.7   Hemoglobin 13.0 - 17.0 g/dL 88.9  88.6  11.5   Hematocrit 39.0 - 52.0 % 34.4  35.1  36.5   Platelets 150 - 400 K/uL 202  248  324       Latest Ref Rng & Units 11/21/2024    8:59 AM 11/07/2024    9:03 AM 10/31/2024    2:38 PM  CMP  Glucose 70 - 99 mg/dL 864  855  83   BUN 8 - 23 mg/dL 15  12  10    Creatinine 0.61 - 1.24 mg/dL 8.92  9.11  9.04   Sodium 135 - 145 mmol/L 139  140  140   Potassium 3.5 - 5.1 mmol/L 3.6  3.7  3.8   Chloride 98 - 111 mmol/L 106  106  106   CO2 22 - 32 mmol/L 24  25  22    Calcium  8.9 - 10.3 mg/dL 9.2  9.4  8.9   Total Protein 6.5 - 8.1 g/dL 6.2  6.4  6.3   Total Bilirubin 0.0 - 1.2 mg/dL 0.2  0.3  0.4   Alkaline Phos 38 - 126 U/L 148  129  137   AST 15 - 41 U/L 16  17  27    ALT 0 - 44 U/L 15  17  31     PATHOLOGY:   Esophageal mass, endoscopic biopsy: At least intramucosal carcinoma, poorly differentiated, 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     Electronically signed by Eva Suzann Joanie Vita, MD on 10/11/2024 at 1412 EDT  Comment   Paraffin embedded tumor tissue (block A1) was submitted for immunohistochemistry to exclude neuroendocrine and squamous differentiation and to investigate DNA mismatch repair protein expression with the following results:   Immunohistochemical stains  Block Stain Result  A1-2 CK5/6 Negative  A1-3 p63 Negative  A1-4 Synaptophysin  Negative  A1-5 CD56 Positive  A1-6 Chromogranin A  Negative  A1-17 MLH1 Retained  A1-18 MSH2 Retained    A1-19 MSH6 Retained    A1-20 PMS2 Retained      INTERPRETATION:  -Immunohistochemical evidence of neuroendocrine differentiation; not diagnostic of neuroendocrine carcinoma. -No defect in DNA mismatch repair is detected, low probability of microsatellite instability-high (MSI-H). Internal controls are adequate.     Diagnosis   A.  CELIAC LYMPH NODE, CORE BIOPSY WITH TOUCH PREPS:              Malignant cells present, consistent with metastatic carcinoma.              See comment.    Electronically signed by Jeraldine Cohn, MD on 10/10/2024 at 1114 EDT  Comment   Immunostains with appropriate controls show that tumor cells are positive for pancytokeratin (AE1/3) and cytokeratin 7, and negative for cytokeratin 20, p40 and INSM-1.   Addendum 2   HER2 Biomarker Testing   Block Number: A1   Result:   HER2:                Positive  (Score 3+)     COMMENT: Fixation time was in compliance with CAP/ASCO guidelines.   The external immunohistochemical controls stained appropriately.     ASSESSMENT & PLAN:  A 68 y.o. 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  He will proceed with his third cycle of FLOT/Durvalumab  today.  Clinically, the patient appears to be doing very well.  He will continue to receive Neulasta  with each cycle of chemotherapy to prevent severe neutropenia from delaying successive cycles of treatment.  Otherwise, I will see this patient  back in 2 weeks before he heads into his 4h and final cycle of neoadjuvant FLOT/Durvalumab .  The patient understands all the plans discussed today and is in agreement with them.  Tanice Petre DELENA Kerns, MD

## 2024-11-21 ENCOUNTER — Other Ambulatory Visit (HOSPITAL_BASED_OUTPATIENT_CLINIC_OR_DEPARTMENT_OTHER): Payer: Self-pay

## 2024-11-21 ENCOUNTER — Inpatient Hospital Stay: Attending: Oncology | Admitting: Oncology

## 2024-11-21 ENCOUNTER — Other Ambulatory Visit: Payer: Self-pay

## 2024-11-21 ENCOUNTER — Telehealth: Payer: Self-pay | Admitting: Oncology

## 2024-11-21 ENCOUNTER — Inpatient Hospital Stay

## 2024-11-21 VITALS — BP 140/75 | HR 68 | Temp 97.7°F | Resp 16 | Ht 69.0 in | Wt 261.0 lb

## 2024-11-21 DIAGNOSIS — C155 Malignant neoplasm of lower third of esophagus: Secondary | ICD-10-CM

## 2024-11-21 DIAGNOSIS — Z5111 Encounter for antineoplastic chemotherapy: Secondary | ICD-10-CM | POA: Diagnosis present

## 2024-11-21 DIAGNOSIS — Z79899 Other long term (current) drug therapy: Secondary | ICD-10-CM | POA: Diagnosis not present

## 2024-11-21 DIAGNOSIS — C159 Malignant neoplasm of esophagus, unspecified: Secondary | ICD-10-CM | POA: Diagnosis present

## 2024-11-21 LAB — CMP (CANCER CENTER ONLY)
ALT: 15 U/L (ref 0–44)
AST: 16 U/L (ref 15–41)
Albumin: 3.6 g/dL (ref 3.5–5.0)
Alkaline Phosphatase: 148 U/L — ABNORMAL HIGH (ref 38–126)
Anion gap: 10 (ref 5–15)
BUN: 15 mg/dL (ref 8–23)
CO2: 24 mmol/L (ref 22–32)
Calcium: 9.2 mg/dL (ref 8.9–10.3)
Chloride: 106 mmol/L (ref 98–111)
Creatinine: 1.07 mg/dL (ref 0.61–1.24)
GFR, Estimated: >60 mL/min (ref >60)
Glucose, Bld: 135 mg/dL — ABNORMAL HIGH (ref 70–99)
Potassium: 3.6 mmol/L (ref 3.5–5.1)
Sodium: 139 mmol/L (ref 135–145)
Total Bilirubin: 0.2 mg/dL (ref 0.0–1.2)
Total Protein: 6.2 g/dL — ABNORMAL LOW (ref 6.5–8.1)

## 2024-11-21 LAB — CBC WITH DIFFERENTIAL (CANCER CENTER ONLY)
Abs Immature Granulocytes: 1.34 K/uL — ABNORMAL HIGH (ref 0.00–0.07)
Basophils Absolute: 0.1 K/uL (ref 0.0–0.1)
Basophils Relative: 0 %
Eosinophils Absolute: 0 K/uL (ref 0.0–0.5)
Eosinophils Relative: 0 %
HCT: 34.4 % — ABNORMAL LOW (ref 39.0–52.0)
Hemoglobin: 11 g/dL — ABNORMAL LOW (ref 13.0–17.0)
Immature Granulocytes: 5 %
Lymphocytes Relative: 10 %
Lymphs Abs: 2.8 K/uL (ref 0.7–4.0)
MCH: 24.7 pg — ABNORMAL LOW (ref 26.0–34.0)
MCHC: 32 g/dL (ref 30.0–36.0)
MCV: 77.3 fL — ABNORMAL LOW (ref 80.0–100.0)
Monocytes Absolute: 0.7 K/uL (ref 0.1–1.0)
Monocytes Relative: 3 %
Neutro Abs: 22 K/uL — ABNORMAL HIGH (ref 1.7–7.7)
Neutrophils Relative %: 82 %
Platelet Count: 202 K/uL (ref 150–400)
RBC: 4.45 MIL/uL (ref 4.22–5.81)
RDW: 18.8 % — ABNORMAL HIGH (ref 11.5–15.5)
WBC Count: 26.9 K/uL — ABNORMAL HIGH (ref 4.0–10.5)
nRBC: 0 % (ref 0.0–0.2)

## 2024-11-21 MED ORDER — LIDOCAINE VISCOUS HCL 2 % MT SOLN
5.0000 mL | OROMUCOSAL | 3 refills | Status: AC | PRN
  Filled 2024-11-21: qty 240, 6d supply, fill #0

## 2024-11-21 MED ORDER — SODIUM CHLORIDE 0.9 % IV SOLN
150.0000 mg | Freq: Once | INTRAVENOUS | Status: AC
  Administered 2024-11-21: 150 mg via INTRAVENOUS
  Filled 2024-11-21: qty 150

## 2024-11-21 MED ORDER — DIPHENHYDRAMINE HCL 50 MG/ML IJ SOLN
25.0000 mg | Freq: Once | INTRAMUSCULAR | Status: AC
  Administered 2024-11-21: 25 mg via INTRAVENOUS
  Filled 2024-11-21: qty 1

## 2024-11-21 MED ORDER — SODIUM CHLORIDE 0.9 % IV SOLN
50.0000 mg/m2 | Freq: Once | INTRAVENOUS | Status: AC
  Administered 2024-11-21: 119 mg via INTRAVENOUS
  Filled 2024-11-21: qty 11.9

## 2024-11-21 MED ORDER — DEXAMETHASONE SOD PHOSPHATE PF 10 MG/ML IJ SOLN
10.0000 mg | Freq: Once | INTRAMUSCULAR | Status: AC
  Administered 2024-11-21: 10 mg via INTRAVENOUS

## 2024-11-21 MED ORDER — FAMOTIDINE IN NACL 20-0.9 MG/50ML-% IV SOLN
20.0000 mg | Freq: Once | INTRAVENOUS | Status: AC
  Administered 2024-11-21: 20 mg via INTRAVENOUS
  Filled 2024-11-21: qty 50

## 2024-11-21 MED ORDER — DEXTROSE 5 % IV SOLN: INTRAVENOUS | Status: DC

## 2024-11-21 MED ORDER — SODIUM CHLORIDE 0.9 % IV SOLN
2600.0000 mg/m2 | INTRAVENOUS | Status: DC
  Administered 2024-11-21: 6200 mg via INTRAVENOUS
  Filled 2024-11-21: qty 124

## 2024-11-21 MED ORDER — PALONOSETRON HCL INJECTION 0.25 MG/5ML
0.2500 mg | Freq: Once | INTRAVENOUS | Status: AC
  Administered 2024-11-21: 0.25 mg via INTRAVENOUS
  Filled 2024-11-21: qty 5

## 2024-11-21 MED ORDER — LEUCOVORIN CALCIUM INJECTION 350 MG
200.0000 mg/m2 | Freq: Once | INTRAVENOUS | Status: AC
  Administered 2024-11-21: 476 mg via INTRAVENOUS
  Filled 2024-11-21: qty 23.8

## 2024-11-21 MED ORDER — OXALIPLATIN CHEMO INJECTION 100 MG/20ML
85.0000 mg/m2 | Freq: Once | INTRAVENOUS | Status: AC
  Administered 2024-11-21: 200 mg via INTRAVENOUS
  Filled 2024-11-21: qty 40

## 2024-11-21 MED ORDER — SODIUM CHLORIDE 0.9% FLUSH: 10.0000 mL | INTRAVENOUS | Status: DC | PRN

## 2024-11-21 NOTE — Patient Instructions (Signed)
 The chemotherapy medication bag should finish at 22 hours. For example, if your pump is scheduled for 46 hours and it was put on at 4:00 p.m., it should finish at 2:00 p.m. the day it is scheduled to come off regardless of your appointment time.     Estimated time to finish at 3pm.   If the display on your pump reads Low Volume and it is beeping, take the batteries out of the pump and come to the cancer center for it to be taken off.   If the pump alarms go off prior to the pump reading Low Volume then call [phone] and someone can assist you.  If the plunger comes out and the chemotherapy medication is leaking out, please use your home chemo spill kit to clean up the spill. Do NOT use paper towels or other household products.  If you have problems or questions regarding your pump, please call either [phone] (24 hours a day) or the cancer center Monday-Friday 8:00 a.m.- 4:30 p.m. at the clinic number and we will assist you. If you are unable to get assistance, then go to the nearest Emergency Department and ask the staff to contact the IV team for assistance.    Paclitaxel Injection What is this medication? PACLITAXEL (PAK li TAX el) treats some types of cancer. It works by slowing down the growth of cancer cells. This medicine may be used for other purposes; ask your health care provider or pharmacist if you have questions. COMMON BRAND NAME(S): Onxol, Taxol What should I tell my care team before I take this medication? They need to know if you have any of these conditions: Heart disease Liver disease Low white blood cell levels An unusual or allergic reaction to paclitaxel, other medications, foods, dyes, or preservatives If you or your partner are pregnant or trying to get pregnant Breast-feeding How should I use this medication? This medication is injected into a vein. It is given by your care team in a hospital or clinic setting. Talk to your care team about the use  of this medication in children. While it may be given to children for selected conditions, precautions do apply. Overdosage: If you think you have taken too much of this medicine contact a poison control center or emergency room at once. NOTE: This medicine is only for you. Do not share this medicine with others. What if I miss a dose? Keep appointments for follow-up doses. It is important not to miss your dose. Call your care team if you are unable to keep an appointment. What may interact with this medication? Do not take this medication with any of the following: Live virus vaccines Other medications may affect the way this medication works. Talk with your care team about all of the medications you take. They may suggest changes to your treatment plan to lower the risk of side effects and to make sure your medications work as intended. This list may not describe all possible interactions. Give your health care provider a list of all the medicines, herbs, non-prescription drugs, or dietary supplements you use. Also tell them if you smoke, drink alcohol, or use illegal drugs. Some items may interact with your medicine. What should I watch for while using this medication? Your condition will be monitored carefully while you are receiving this medication. You may need blood work while taking this medication. This medication may make you feel generally unwell. This is not uncommon as chemotherapy can affect healthy cells as well as  cancer cells. Report any side effects. Continue your course of treatment even though you feel ill unless your care team tells you to stop. This medication can cause serious allergic reactions. To reduce the risk, your care team may give you other medications to take before receiving this one. Be sure to follow the directions from your care team. This medication may increase your risk of getting an infection. Call your care team for advice if you get a fever, chills, sore  throat, or other symptoms of a cold or flu. Do not treat yourself. Try to avoid being around people who are sick. This medication may increase your risk to bruise or bleed. Call your care team if you notice any unusual bleeding. Be careful brushing or flossing your teeth or using a toothpick because you may get an infection or bleed more easily. If you have any dental work done, tell your dentist you are receiving this medication. Talk to your care team if you may be pregnant. Serious birth defects can occur if you take this medication during pregnancy. Talk to your care team before breastfeeding. Changes to your treatment plan may be needed. What side effects may I notice from receiving this medication? Side effects that you should report to your care team as soon as possible: Allergic reactions--skin rash, itching, hives, swelling of the face, lips, tongue, or throat Heart rhythm changes--fast or irregular heartbeat, dizziness, feeling faint or lightheaded, chest pain, trouble breathing Increase in blood pressure Infection--fever, chills, cough, sore throat, wounds that don't heal, pain or trouble when passing urine, general feeling of discomfort or being unwell Low blood pressure--dizziness, feeling faint or lightheaded, blurry vision Low red blood cell level--unusual weakness or fatigue, dizziness, headache, trouble breathing Painful swelling, warmth, or redness of the skin, blisters or sores at the infusion site Pain, tingling, or numbness in the hands or feet Slow heartbeat--dizziness, feeling faint or lightheaded, confusion, trouble breathing, unusual weakness or fatigue Unusual bruising or bleeding Side effects that usually do not require medical attention (report to your care team if they continue or are bothersome): Diarrhea Hair loss Joint pain Loss of appetite Muscle pain Nausea Vomiting This list may not describe all possible side effects. Call your doctor for medical advice  about side effects. You may report side effects to FDA at 1-800-FDA-1088. Where should I keep my medication? This medication is given in a hospital or clinic. It will not be stored at home. NOTE: This sheet is a summary. It may not cover all possible information. If you have questions about this medicine, talk to your doctor, pharmacist, or health care provider.  2024 Elsevier/Gold Standard (2022-04-29 00:00:00)Leucovorin  Injection What is this medication? LEUCOVORIN  (loo koe VOR in) prevents side effects from certain medications, such as methotrexate. It works by increasing folate levels. This helps protect healthy cells in your body. It may also be used to treat anemia caused by low levels of folate. It can also be used with fluorouracil , a type of chemotherapy, to treat colorectal cancer. It works by increasing the effects of fluorouracil  in the body. This medicine may be used for other purposes; ask your health care provider or pharmacist if you have questions. What should I tell my care team before I take this medication? They need to know if you have any of these conditions: Anemia from low levels of vitamin B12 in the blood An unusual or allergic reaction to leucovorin , folic acid, other medications, foods, dyes, or preservatives Pregnant or trying to get  pregnant Breastfeeding How should I use this medication? This medication is injected into a vein or a muscle. It is given by your care team in a hospital or clinic setting. Talk to your care team about the use of this medication in children. Special care may be needed. Overdosage: If you think you have taken too much of this medicine contact a poison control center or emergency room at once. NOTE: This medicine is only for you. Do not share this medicine with others. What if I miss a dose? Keep appointments for follow-up doses. It is important not to miss your dose. Call your care team if you are unable to keep an appointment. What may  interact with this medication? Capecitabine Fluorouracil  Phenobarbital Phenytoin Primidone Trimethoprim;sulfamethoxazole This list may not describe all possible interactions. Give your health care provider a list of all the medicines, herbs, non-prescription drugs, or dietary supplements you use. Also tell them if you smoke, drink alcohol, or use illegal drugs. Some items may interact with your medicine. What should I watch for while using this medication? Your condition will be monitored carefully while you are receiving this medication. This medication may increase the side effects of 5-fluorouracil . Tell your care team if you have diarrhea or mouth sores that do not get better or that get worse. What side effects may I notice from receiving this medication? Side effects that you should report to your care team as soon as possible: Allergic reactions--skin rash, itching, hives, swelling of the face, lips, tongue, or throat This list may not describe all possible side effects. Call your doctor for medical advice about side effects. You may report side effects to FDA at 1-800-FDA-1088. Where should I keep my medication? This medication is given in a hospital or clinic. It will not be stored at home. NOTE: This sheet is a summary. It may not cover all possible information. If you have questions about this medicine, talk to your doctor, pharmacist, or health care provider.  2024 Elsevier/Gold Standard (2022-05-13 00:00:00)Oxaliplatin  Injection What is this medication? OXALIPLATIN  (ox AL i PLA tin) treats colorectal cancer. It works by slowing down the growth of cancer cells. This medicine may be used for other purposes; ask your health care provider or pharmacist if you have questions. COMMON BRAND NAME(S): Eloxatin  What should I tell my care team before I take this medication? They need to know if you have any of these conditions: Heart disease History of irregular heartbeat or  rhythm Liver disease Low blood cell levels (white cells, red cells, and platelets) Lung or breathing disease, such as asthma Take medications that treat or prevent blood clots Tingling of the fingers, toes, or other nerve disorder An unusual or allergic reaction to oxaliplatin , other medications, foods, dyes, or preservatives If you or your partner are pregnant or trying to get pregnant Breast-feeding How should I use this medication? This medication is injected into a vein. It is given by your care team in a hospital or clinic setting. Talk to your care team about the use of this medication in children. Special care may be needed. Overdosage: If you think you have taken too much of this medicine contact a poison control center or emergency room at once. NOTE: This medicine is only for you. Do not share this medicine with others. What if I miss a dose? Keep appointments for follow-up doses. It is important not to miss a dose. Call your care team if you are unable to keep an appointment. What may  interact with this medication? Do not take this medication with any of the following: Cisapride Dronedarone Pimozide Thioridazine This medication may also interact with the following: Aspirin and aspirin-like medications Certain medications that treat or prevent blood clots, such as warfarin, apixaban, dabigatran, and rivaroxaban Cisplatin Cyclosporine Diuretics Medications for infection, such as acyclovir, adefovir, amphotericin B, bacitracin, cidofovir, foscarnet, ganciclovir, gentamicin, pentamidine, vancomycin NSAIDs, medications for pain and inflammation, such as ibuprofen or naproxen Other medications that cause heart rhythm changes Pamidronate Zoledronic acid This list may not describe all possible interactions. Give your health care provider a list of all the medicines, herbs, non-prescription drugs, or dietary supplements you use. Also tell them if you smoke, drink alcohol, or use  illegal drugs. Some items may interact with your medicine. What should I watch for while using this medication? Your condition will be monitored carefully while you are receiving this medication. You may need blood work while taking this medication. This medication may make you feel generally unwell. This is not uncommon as chemotherapy can affect healthy cells as well as cancer cells. Report any side effects. Continue your course of treatment even though you feel ill unless your care team tells you to stop. This medication may increase your risk of getting an infection. Call your care team for advice if you get a fever, chills, sore throat, or other symptoms of a cold or flu. Do not treat yourself. Try to avoid being around people who are sick. Avoid taking medications that contain aspirin, acetaminophen , ibuprofen, naproxen, or ketoprofen unless instructed by your care team. These medications may hide a fever. Be careful brushing or flossing your teeth or using a toothpick because you may get an infection or bleed more easily. If you have any dental work done, tell your dentist you are receiving this medication. This medication can make you more sensitive to cold. Do not drink cold drinks or use ice. Cover exposed skin before coming in contact with cold temperatures or cold objects. When out in cold weather wear warm clothing and cover your mouth and nose to warm the air that goes into your lungs. Tell your care team if you get sensitive to the cold. Talk to your care team if you or your partner are pregnant or think either of you might be pregnant. This medication can cause serious birth defects if taken during pregnancy and for 9 months after the last dose. A negative pregnancy test is required before starting this medication. A reliable form of contraception is recommended while taking this medication and for 9 months after the last dose. Talk to your care team about effective forms of contraception.  Do not father a child while taking this medication and for 6 months after the last dose. Use a condom while having sex during this time period. Do not breastfeed while taking this medication and for 3 months after the last dose. This medication may cause infertility. Talk to your care team if you are concerned about your fertility. What side effects may I notice from receiving this medication? Side effects that you should report to your care team as soon as possible: Allergic reactions--skin rash, itching, hives, swelling of the face, lips, tongue, or throat Bleeding--bloody or black, tar-like stools, vomiting blood or brown material that looks like coffee grounds, red or dark brown urine, small red or purple spots on skin, unusual bruising or bleeding Dry cough, shortness of breath or trouble breathing Heart rhythm changes--fast or irregular heartbeat, dizziness, feeling faint or lightheaded,  chest pain, trouble breathing Infection--fever, chills, cough, sore throat, wounds that don't heal, pain or trouble when passing urine, general feeling of discomfort or being unwell Liver injury--right upper belly pain, loss of appetite, nausea, light-colored stool, dark yellow or brown urine, yellowing skin or eyes, unusual weakness or fatigue Low red blood cell level--unusual weakness or fatigue, dizziness, headache, trouble breathing Muscle injury--unusual weakness or fatigue, muscle pain, dark yellow or brown urine, decrease in amount of urine Pain, tingling, or numbness in the hands or feet Sudden and severe headache, confusion, change in vision, seizures, which may be signs of posterior reversible encephalopathy syndrome (PRES) Unusual bruising or bleeding Side effects that usually do not require medical attention (report to your care team if they continue or are bothersome): Diarrhea Nausea Pain, redness, or swelling with sores inside the mouth or throat Unusual weakness or fatigue Vomiting This  list may not describe all possible side effects. Call your doctor for medical advice about side effects. You may report side effects to FDA at 1-800-FDA-1088. Where should I keep my medication? This medication is given in a hospital or clinic. It will not be stored at home. NOTE: This sheet is a summary. It may not cover all possible information. If you have questions about this medicine, talk to your doctor, pharmacist, or health care provider.  2024 Elsevier/Gold Standard (2023-11-20 00:00:00)

## 2024-11-21 NOTE — Telephone Encounter (Signed)
 Patient has been scheduled for follow-up visit per 11/16/2024 LOS.  Pt given an appt calendar with date and time.

## 2024-11-22 ENCOUNTER — Inpatient Hospital Stay

## 2024-11-22 ENCOUNTER — Other Ambulatory Visit (HOSPITAL_BASED_OUTPATIENT_CLINIC_OR_DEPARTMENT_OTHER): Payer: Self-pay

## 2024-11-22 ENCOUNTER — Other Ambulatory Visit: Payer: Self-pay

## 2024-11-22 NOTE — Progress Notes (Signed)
 1515 THE PATIENT COMPLAINT OF BILATERAL HANDS TINGLING AND NUMBNESS. HE HAD BEEN OUTSIDE WITHOUT GLOVES ON. HE ALSO COMPLAINT OF BOTH HANDS ITCHING AS WELL. I ENCOURAGED THE PATIENT TO WEAR GLOVES AND MOUTH COVERING DURING THESE COLD DAYS TO DECREASE THE SIDE EFFECTS OF THE OXALIPLATIN  DRUG. MELISSA PARSONS, NP INFORMED OF THE ABOVE AND AGREES. MELISSA INFORMED THE PATIENT TO TAKE ZYRTEC FOR A FEW DAYS TO SEE IF THIS WOULD HELP WITH THE ITCHING. THE PATIENT AND WIFE UNDERSTANDS TO GO TO THE EMERGENCY ROOM IF THE SYMPTOMS GET WORSE.

## 2024-11-23 ENCOUNTER — Inpatient Hospital Stay

## 2024-11-23 VITALS — BP 134/66 | HR 61 | Temp 98.1°F | Resp 18

## 2024-11-23 DIAGNOSIS — C155 Malignant neoplasm of lower third of esophagus: Secondary | ICD-10-CM

## 2024-11-23 DIAGNOSIS — Z5111 Encounter for antineoplastic chemotherapy: Secondary | ICD-10-CM | POA: Diagnosis not present

## 2024-11-23 MED ORDER — PEGFILGRASTIM-CBQV 6 MG/0.6ML ~~LOC~~ SOSY
6.0000 mg | PREFILLED_SYRINGE | Freq: Once | SUBCUTANEOUS | Status: AC
  Administered 2024-11-23: 6 mg via SUBCUTANEOUS
  Filled 2024-11-23: qty 0.6

## 2024-11-23 NOTE — Patient Instructions (Signed)

## 2024-12-04 NOTE — Progress Notes (Unsigned)
 [facility] at [facility] [address] [place],  [place]  [address] [phone]  Clinic Day: 12/05/2024  Referring physician: Jackolyn Darice BROCKS, FNP   HISTORY OF PRESENT ILLNESS:  The patient is a 68 y.o. 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  He comes in today to be evaluated before he heads into his 4th and final cycle of neoadjuvant FLOT/Durvalumab  chemotherapy.   He claims to have tolerated his 3rd cycle of treatment very well.  He continues to deny having any issue  with dysphagia.  He denies having any other symptoms/findings which concern him for disease progression.   PHYSICAL EXAM:  Blood pressure 134/70, pulse 73, temperature 97.8 F (36.6 C), temperature source Oral, resp. rate 16, height 5' 9 (1.753 m), weight 261 lb (118.4 kg), SpO2 97%. Wt Readings from Last 3 Encounters:  12/05/24 261 lb (118.4 kg)  11/22/24 261 lb 12.8 oz (118.8 kg)  11/21/24 261 lb (118.4 kg)   Body mass index is 38.54 kg/m. Performance status (ECOG): 1 - Symptomatic but completely ambulatory Physical Exam Constitutional:      Appearance: Normal appearance. He is obese. He is not ill-appearing.  HENT:     Mouth/Throat:     Mouth: Mucous membranes are moist.     Dentition: Abnormal dentition. Dental caries present.     Pharynx: Oropharynx is clear. No oropharyngeal exudate or posterior oropharyngeal erythema.  Cardiovascular:     Rate and Rhythm: Normal rate and regular rhythm.     Heart sounds: No murmur heard.    No friction rub. No gallop.  Pulmonary:     Effort: Pulmonary effort is normal. No respiratory distress.     Breath sounds: Normal breath sounds. No wheezing, rhonchi or rales.  Abdominal:     General: Bowel sounds are normal. There is no distension.     Palpations: Abdomen is soft. There is no mass.     Tenderness: There is no abdominal tenderness.  Musculoskeletal:        General: No swelling.     Right lower leg: No edema.     Left lower leg:  No edema.  Lymphadenopathy:     Cervical: No cervical adenopathy.     Upper Body:     Right upper body: No supraclavicular or axillary adenopathy.     Left upper body: No supraclavicular or axillary adenopathy.     Lower Body: No right inguinal adenopathy. No left inguinal adenopathy.  Skin:    General: Skin is warm.     Coloration: Skin is not jaundiced.     Findings: No lesion or rash.  Neurological:     General: No focal deficit present.     Mental Status: He is alert and oriented to person, place, and time. Mental status is at baseline.  Psychiatric:        Mood and Affect: Mood normal.        Behavior: Behavior normal.        Thought Content: Thought content normal.    LABS:      Latest Ref Rng & Units 12/05/2024    9:07 AM 11/21/2024    8:59 AM 11/07/2024    9:03 AM  CBC  WBC 4.0 - 10.5 K/uL 14.1  26.9  23.0   Hemoglobin 13.0 - 17.0 g/dL 89.3  88.9  88.6   Hematocrit 39.0 - 52.0 % 34.4  34.4  35.1   Platelets 150 - 400 K/uL 205  202  248       Latest Ref Rng & Units 12/05/2024    9:07 AM 11/21/2024    8:59 AM 11/07/2024    9:03 AM  CMP  Glucose 70 - 99 mg/dL 893  864  855   BUN 8 - 23 mg/dL 9  15  12    Creatinine 0.61 - 1.24 mg/dL 9.01  8.92  9.11   Sodium 135 - 145 mmol/L 144  139  140   Potassium 3.5 - 5.1 mmol/L 3.0  3.6  3.7   Chloride 98 - 111 mmol/L 110  106  106   CO2 22 - 32 mmol/L 23  24  25    Calcium  8.9 - 10.3 mg/dL 8.8  9.2  9.4   Total Protein 6.5 - 8.1 g/dL 6.0  6.2  6.4   Total Bilirubin 0.0 - 1.2 mg/dL 0.3  0.2  0.3   Alkaline Phos 38 - 126 U/L 136  148  129   AST 15 - 41 U/L 26  16  17    ALT 0 - 44 U/L 28  15  17     PATHOLOGY:   Esophageal mass, endoscopic biopsy: At least intramucosal carcinoma, poorly differentiated, 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    Electronically signed by Eva Suzann Joanie Vita, MD on 10/11/2024 at 1412 EDT  Comment   Paraffin embedded tumor tissue (block A1) was submitted  for immunohistochemistry to exclude neuroendocrine and squamous differentiation and to investigate DNA mismatch repair protein expression with the following results:   Immunohistochemical stains  Block Stain Result  A1-2 CK5/6 Negative  A1-3 p63 Negative  A1-4 Synaptophysin  Negative  A1-5 CD56 Positive  A1-6 Chromogranin A  Negative  A1-17 MLH1 Retained  A1-18 MSH2 Retained    A1-19 MSH6 Retained    A1-20 PMS2 Retained      INTERPRETATION:  -Immunohistochemical evidence of neuroendocrine differentiation; not diagnostic of neuroendocrine carcinoma. -No defect in DNA mismatch repair is detected, low probability of microsatellite instability-high (MSI-H). Internal controls are adequate.     Diagnosis   A.  CELIAC LYMPH NODE, CORE BIOPSY WITH TOUCH PREPS:              Malignant cells present, consistent with metastatic carcinoma.              See comment.    Electronically signed by Jeraldine Cohn, MD on 10/10/2024 at 1114 EDT  Comment   Immunostains with appropriate controls show that tumor cells are positive for pancytokeratin (AE1/3) and cytokeratin 7, and negative for cytokeratin 20, p40 and INSM-1.   Addendum 2   HER2 Biomarker Testing   Block Number: A1   Result:   HER2:                Positive  (Score 3+)     COMMENT: Fixation time was in compliance with CAP/ASCO guidelines.   The external immunohistochemical controls stained appropriately.     ASSESSMENT & PLAN:  A 68 y.o. 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  He will proceed with his fourth and final cycle of neoadjuvant FLOT/Durvalumab  today.  Clinically, the patient appears to be doing very well.  I will see this patient back in 3 weeks for repeat clinical assessment.  A PET scan will be done a day before his next visit to ascertain his new disease baseline after the completion of his neoadjuvant chemo/immunotherapy.  The patient understands all the plans discussed today and  is in agreement with  them.  Amreen Raczkowski DELENA Kerns, MD

## 2024-12-05 ENCOUNTER — Other Ambulatory Visit (HOSPITAL_BASED_OUTPATIENT_CLINIC_OR_DEPARTMENT_OTHER): Payer: Self-pay

## 2024-12-05 ENCOUNTER — Other Ambulatory Visit: Payer: Self-pay | Admitting: Oncology

## 2024-12-05 ENCOUNTER — Inpatient Hospital Stay: Admitting: Oncology

## 2024-12-05 ENCOUNTER — Inpatient Hospital Stay

## 2024-12-05 ENCOUNTER — Telehealth: Payer: Self-pay | Admitting: Oncology

## 2024-12-05 ENCOUNTER — Encounter: Payer: Self-pay | Admitting: Oncology

## 2024-12-05 VITALS — BP 134/70 | HR 73 | Temp 97.8°F | Resp 16 | Ht 69.0 in | Wt 261.0 lb

## 2024-12-05 DIAGNOSIS — C155 Malignant neoplasm of lower third of esophagus: Secondary | ICD-10-CM

## 2024-12-05 DIAGNOSIS — Z5111 Encounter for antineoplastic chemotherapy: Secondary | ICD-10-CM | POA: Diagnosis not present

## 2024-12-05 DIAGNOSIS — E876 Hypokalemia: Secondary | ICD-10-CM

## 2024-12-05 LAB — CBC WITH DIFFERENTIAL (CANCER CENTER ONLY)
Abs Immature Granulocytes: 0.54 K/uL — ABNORMAL HIGH (ref 0.00–0.07)
Basophils Absolute: 0.1 K/uL (ref 0.0–0.1)
Basophils Relative: 1 %
Eosinophils Absolute: 0.1 K/uL (ref 0.0–0.5)
Eosinophils Relative: 1 %
HCT: 34.4 % — ABNORMAL LOW (ref 39.0–52.0)
Hemoglobin: 10.6 g/dL — ABNORMAL LOW (ref 13.0–17.0)
Immature Granulocytes: 4 %
Lymphocytes Relative: 22 %
Lymphs Abs: 3.1 K/uL (ref 0.7–4.0)
MCH: 24.7 pg — ABNORMAL LOW (ref 26.0–34.0)
MCHC: 30.8 g/dL (ref 30.0–36.0)
MCV: 80 fL (ref 80.0–100.0)
Monocytes Absolute: 1.1 K/uL — ABNORMAL HIGH (ref 0.1–1.0)
Monocytes Relative: 7 %
Neutro Abs: 9.2 K/uL — ABNORMAL HIGH (ref 1.7–7.7)
Neutrophils Relative %: 65 %
Platelet Count: 205 K/uL (ref 150–400)
RBC: 4.3 MIL/uL (ref 4.22–5.81)
RDW: 20.1 % — ABNORMAL HIGH (ref 11.5–15.5)
WBC Count: 14.1 K/uL — ABNORMAL HIGH (ref 4.0–10.5)
nRBC: 0.1 % (ref 0.0–0.2)

## 2024-12-05 LAB — CMP (CANCER CENTER ONLY)
ALT: 28 U/L (ref 0–44)
AST: 26 U/L (ref 15–41)
Albumin: 3.7 g/dL (ref 3.5–5.0)
Alkaline Phosphatase: 136 U/L — ABNORMAL HIGH (ref 38–126)
Anion gap: 11 (ref 5–15)
BUN: 9 mg/dL (ref 8–23)
CO2: 23 mmol/L (ref 22–32)
Calcium: 8.8 mg/dL — ABNORMAL LOW (ref 8.9–10.3)
Chloride: 110 mmol/L (ref 98–111)
Creatinine: 0.98 mg/dL (ref 0.61–1.24)
GFR, Estimated: >60 mL/min (ref >60)
Glucose, Bld: 106 mg/dL — ABNORMAL HIGH (ref 70–99)
Potassium: 3 mmol/L — ABNORMAL LOW (ref 3.5–5.1)
Sodium: 144 mmol/L (ref 135–145)
Total Bilirubin: 0.3 mg/dL (ref 0.0–1.2)
Total Protein: 6 g/dL — ABNORMAL LOW (ref 6.5–8.1)

## 2024-12-05 LAB — TSH: TSH: 1.25 u[IU]/mL (ref 0.350–4.500)

## 2024-12-05 MED ORDER — POTASSIUM CHLORIDE ER 10 MEQ PO CPCR
10.0000 meq | ORAL_CAPSULE | Freq: Two times a day (BID) | ORAL | 0 refills | Status: AC
  Filled 2024-12-05: qty 10, 5d supply, fill #0

## 2024-12-05 MED ORDER — SODIUM CHLORIDE 0.9 % IV SOLN
1500.0000 mg | Freq: Once | INTRAVENOUS | Status: AC
  Administered 2024-12-05: 12:00:00 1500 mg via INTRAVENOUS
  Filled 2024-12-05: qty 30

## 2024-12-05 MED ORDER — OXALIPLATIN CHEMO INJECTION 100 MG/20ML
85.0000 mg/m2 | Freq: Once | INTRAVENOUS | Status: AC
  Administered 2024-12-05: 14:00:00 200 mg via INTRAVENOUS
  Filled 2024-12-05: qty 40

## 2024-12-05 MED ORDER — PALONOSETRON HCL INJECTION 0.25 MG/5ML
0.2500 mg | Freq: Once | INTRAVENOUS | Status: AC
  Administered 2024-12-05: 11:00:00 0.25 mg via INTRAVENOUS
  Filled 2024-12-05: qty 5

## 2024-12-05 MED ORDER — DEXAMETHASONE SOD PHOSPHATE PF 10 MG/ML IJ SOLN
10.0000 mg | Freq: Once | INTRAMUSCULAR | Status: AC
  Administered 2024-12-05: 11:00:00 10 mg via INTRAVENOUS

## 2024-12-05 MED ORDER — LEUCOVORIN CALCIUM INJECTION 350 MG
200.0000 mg/m2 | Freq: Once | INTRAVENOUS | Status: AC
  Administered 2024-12-05: 14:00:00 476 mg via INTRAVENOUS
  Filled 2024-12-05: qty 23.8

## 2024-12-05 MED ORDER — SODIUM CHLORIDE 0.9 % IV SOLN
150.0000 mg | Freq: Once | INTRAVENOUS | Status: AC
  Administered 2024-12-05: 11:00:00 150 mg via INTRAVENOUS
  Filled 2024-12-05: qty 150

## 2024-12-05 MED ORDER — SODIUM CHLORIDE 0.9 % IV SOLN
2600.0000 mg/m2 | INTRAVENOUS | Status: DC
  Administered 2024-12-05: 16:00:00 6200 mg via INTRAVENOUS
  Filled 2024-12-05: qty 124

## 2024-12-05 MED ORDER — FAMOTIDINE IN NACL 20-0.9 MG/50ML-% IV SOLN
20.0000 mg | Freq: Once | INTRAVENOUS | Status: AC
  Administered 2024-12-05: 11:00:00 20 mg via INTRAVENOUS
  Filled 2024-12-05: qty 50

## 2024-12-05 MED ORDER — SODIUM CHLORIDE 0.9 % IV SOLN
50.0000 mg/m2 | Freq: Once | INTRAVENOUS | Status: AC
  Administered 2024-12-05: 13:00:00 119 mg via INTRAVENOUS
  Filled 2024-12-05: qty 11.9

## 2024-12-05 MED ORDER — DIPHENHYDRAMINE HCL 50 MG/ML IJ SOLN
25.0000 mg | Freq: Once | INTRAMUSCULAR | Status: AC
  Administered 2024-12-05: 11:00:00 25 mg via INTRAVENOUS
  Filled 2024-12-05: qty 1

## 2024-12-05 MED ORDER — DEXTROSE 5 % IV SOLN: INTRAVENOUS | Status: DC

## 2024-12-05 NOTE — Telephone Encounter (Signed)
 Patient has been scheduled for follow-up visit per 12/05/2024 LOS.  Pt given an appt calendar with date and time.

## 2024-12-05 NOTE — Patient Instructions (Signed)
 [facility] - A DEPT OF [facility]  Discharge Instructions: Thank you for choosing [facility] to provide your oncology and hematology care.  If you have a lab appointment with the Cancer Center, please go directly to the Cancer Center and check in at the registration area.   Wear comfortable clothing and clothing appropriate for easy access to any Portacath or PICC line.   We strive to give you quality time with your provider. You may need to reschedule your appointment if you arrive late (15 or more minutes).  Arriving late affects you and other patients whose appointments are after yours.  Also, if you miss three or more appointments without notifying the office, you may be dismissed from the clinic at the provider's discretion.      For prescription refill requests, have your pharmacy contact our office and allow 72 hours for refills to be completed.    Today you received the following chemotherapy and/or immunotherapy agents FLOT   To help prevent nausea and vomiting after your treatment, we encourage you to take your nausea medication as directed.  BELOW ARE SYMPTOMS THAT SHOULD BE REPORTED IMMEDIATELY: *FEVER GREATER THAN 100.4 F (38 C) OR HIGHER *CHILLS OR SWEATING *NAUSEA AND VOMITING THAT IS NOT CONTROLLED WITH YOUR NAUSEA MEDICATION *UNUSUAL SHORTNESS OF BREATH *UNUSUAL BRUISING OR BLEEDING *URINARY PROBLEMS (pain or burning when urinating, or frequent urination) *BOWEL PROBLEMS (unusual diarrhea, constipation, pain near the anus) TENDERNESS IN MOUTH AND THROAT WITH OR WITHOUT PRESENCE OF ULCERS (sore throat, sores in mouth, or a toothache) UNUSUAL RASH, SWELLING OR PAIN  UNUSUAL VAGINAL DISCHARGE OR ITCHING   Items with * indicate a potential emergency and should be followed up as soon as possible or go to the Emergency Department if any problems should occur.  Please show the CHEMOTHERAPY ALERT CARD or IMMUNOTHERAPY ALERT CARD at  check-in to the Emergency Department and triage nurse.  Should you have questions after your visit or need to cancel or reschedule your appointment, please contact [facility] [place] - A DEPT OF [facility]  Dept: [phone]  and follow the prompts.  Office hours are 8:00 a.m. to 4:30 p.m. Monday - Friday. Please note that voicemails left after 4:00 p.m. may not be returned until the following business day.  We are closed weekends and major holidays. You have access to a nurse at all times for urgent questions. Please call the main number to the clinic Dept: [phone] and follow the prompts.  For any non-urgent questions, you may also contact your provider using MyChart. We now offer e-Visits for anyone 45 and older to request care online for non-urgent symptoms. For details visit [email or web address].   Also download the MyChart app! Go to the app store, search MyChart, open the app, select [facility], and log in with your MyChart username and password.

## 2024-12-06 ENCOUNTER — Other Ambulatory Visit: Payer: Self-pay | Admitting: Pharmacist

## 2024-12-06 ENCOUNTER — Other Ambulatory Visit: Payer: Self-pay | Admitting: Hematology and Oncology

## 2024-12-06 ENCOUNTER — Other Ambulatory Visit: Payer: Self-pay

## 2024-12-06 ENCOUNTER — Inpatient Hospital Stay

## 2024-12-06 VITALS — BP 149/80 | HR 81 | Temp 98.3°F | Resp 18

## 2024-12-06 DIAGNOSIS — Z5111 Encounter for antineoplastic chemotherapy: Secondary | ICD-10-CM | POA: Diagnosis not present

## 2024-12-06 DIAGNOSIS — C155 Malignant neoplasm of lower third of esophagus: Secondary | ICD-10-CM

## 2024-12-06 MED ORDER — LORAZEPAM 1 MG PO TABS: 1.0000 mg | ORAL_TABLET | Freq: Once | ORAL | 0 refills | Status: AC

## 2024-12-06 MED ORDER — FAMOTIDINE IN NACL 20-0.9 MG/50ML-% IV SOLN
20.0000 mg | Freq: Once | INTRAVENOUS | Status: AC
  Administered 2024-12-06: 16:00:00 20 mg via INTRAVENOUS
  Filled 2024-12-06: qty 50

## 2024-12-06 NOTE — Progress Notes (Signed)
 Pt came in for pump DC.  States he had severe itching for the last 18 hours, upper chest is red, no rash noted.  States his hands swelled but that has now resolved.  States he was cold and had chills all night also.  Did not check temp.  Devere Manzanilla and Melissa NP at bedside, new orders given.  Pt aware of instructions to take benadryl  as needed at home for itching.  IV pepcid  given over 15 minutes.  Pt agrees to call and report any further symptoms.

## 2024-12-06 NOTE — Patient Instructions (Signed)
 Dermatitis (Eczema)  The exact cause of dermatitis or eczema is unknown.  However, many adults develop eczema as a result of progressive dryness of skin that occurs with age, compounded with seasonal variations in humidity.  Certainly, dry skin and irritants do play a role.  In addition, one may have a family history of dry, itchy skin.   In eczema, the skin is dry and the rash is very itchy.  The rash may involve the face or cover a large part of the body.  When the rash becomes more established, the dry itchy skin may become thickened, leathery and sometimes darker in coloration.  The more the person scratches, the worse the rash is and the thicker the skin gets.   Many things may affect the severity of the condition.  Many will find that during the winter months when the humidity is very low, the dryness and itchiness will be worse.  On the other hand, some people are easily irritated by sweat and will find that they have more problems during the summer months.  Most patients note an increase in itching at times when there are sudden changes in temperature.  Other irritants such as harsh soaps (which include many of the antibacterial soaps) or detergents and exposure to wool are common problems.  Sometimes eczema may become infected by bacteria, yeast or viruses.  This is called secondary infection.  Bacterial secondary infection is the most common and is often a result of scratching.  The rash gets very red with pus filled pimples and scabs.  If this occurs, your doctor will prescribe an antibiotic to control the infection.  What can I expect from treatment? Unfortunately, there is no magic cure that will always eliminate dermatitis. The main objective in treating dermatitis is to decrease the skin eruption and relieve the itching.  There are a number of different forms of the medications that are used for dermatitis, and medications that are best suited to control the problem will be chosen.   Primarily, topical medications will be used.  Because the skin is excessively dry, lubricants will be prescribed that will effectively decrease the dryness.  Daily bathing is a useful way to get water into the skin but bathing should be brief (no more than 10 minutes unless otherwise indicated by your physician).  Effective lubricants (Cetaphil cream or lotion, CeraVe cream or lotion [available at [employer], [facility], and [facility]], Aquaphor, and plain Vaseline) can be used immediately after the bath or shower to trap moisture within the skin.  It is best to pat dry after a bathing and then mix your moisturizer (cream or lotion) with the moisture left on your skin.  Cortisone (steroid) is a medicated ointment or cream (ex: triamcinolone, hydrocortisone, desonide, betamethasone, clobetasol) that may also be suggested and is very important in decreasing the itching and controlling the inflammation.  Your doctor will suggest a cortisone treatment that is most appropriate for the severity and location of the dermatitis that is to be treated.  Once the affected area clears up, it is best to discontinue the use of the cortisone preparation due to possibility of atrophy (skin thinning), but continue the regular use of lubrication to try to prevent new areas of dermatitis from occurring.  Of course, if itching or a new rash begins, the cortisone preparation may have to be reintroduced.  Anti-inflammatory creams and ointments which are NOT steroids such as Protopic and Elidel may also be prescribed.  Certain internal medicines called antihistamines (Atarax,  Benadryl , Hydroxyzine) may help control itching.  They primarily help with the itching by introducing some drowsiness and allowing the child to sleep at night.  Some systemic antibiotics are often useful as well for controlling the secondary infection and often enable infected dermatitis to be controlled.  Other important forms of treatment: Avoid contact with  substances you know cause itching.  These may include soaps, detergents, certain perfumes, dust, grass, weeds, wools and other types of scratchy clothing. You may bathe daily.  Use no soap or the minimal amount necessary to get clean.  Always use lubrication immediately after bathing (3 minute rule). Avoid very hot or very cold water and bubble baths.  When drying with a towel - pat dry and do not rub.  Use a mild, unscented soap (Dove, CeraVe Cleanser, Lever 2000, or Cetaphil). Try to keep the temperature and humidity in the home fairly constant.  Use a bedroom air conditioner in the summer and a vaporizer in the winter.  It is very important that the vaporizer or humidifier be cleaned frequently and thoroughly since molds may grow and cause allergies. Try to avoid scratching.  Dermatitis is often called the itch that rashes and it is known that scratching plays a significant role in making dermatitis worse.  Keeping the nails short and well-filed as well as using other measures to help keep the child from itching are helpful.  The National Eczema Association (www.eczema-assn.org) is a wonderful organization that sends out a dealer with useful information on these types of conditions. Please consider contacting them at the above website or by address: [employer], [address], [address], [place] , [address]

## 2024-12-06 NOTE — Progress Notes (Signed)
 Patient came in for 5FU pump dc and c/o of itching all over and had some bruising from scratching on both dorsal hands and some on arms.  Patient also noted to have redness on chest.  Patient reported that it was very bad yesterday but that it resolved today.  Pepcid  20 mg IV was given today to help with redness and itching.  Patient was educated that this is likely due to the durvalumab  component of his regimen and this is a known side effect.  Patient was told to contact our office if the itching gets worse.  Patient was thankful and agreed with plan.

## 2024-12-07 ENCOUNTER — Inpatient Hospital Stay

## 2024-12-07 VITALS — BP 155/77 | HR 66 | Temp 98.0°F | Resp 18

## 2024-12-07 DIAGNOSIS — Z5111 Encounter for antineoplastic chemotherapy: Secondary | ICD-10-CM | POA: Diagnosis not present

## 2024-12-07 DIAGNOSIS — C155 Malignant neoplasm of lower third of esophagus: Secondary | ICD-10-CM

## 2024-12-07 MED ORDER — PEGFILGRASTIM-CBQV 6 MG/0.6ML ~~LOC~~ SOSY
6.0000 mg | PREFILLED_SYRINGE | Freq: Once | SUBCUTANEOUS | Status: AC
  Administered 2024-12-07: 16:00:00 6 mg via SUBCUTANEOUS
  Filled 2024-12-07: qty 0.6

## 2024-12-07 NOTE — Patient Instructions (Signed)

## 2024-12-26 ENCOUNTER — Encounter: Payer: Self-pay | Admitting: Oncology

## 2024-12-26 ENCOUNTER — Inpatient Hospital Stay: Attending: Oncology

## 2024-12-26 ENCOUNTER — Ambulatory Visit (INDEPENDENT_AMBULATORY_CARE_PROVIDER_SITE_OTHER)
Admission: RE | Admit: 2024-12-26 | Discharge: 2024-12-26 | Disposition: A | Discharge status: Home / Self Care | Source: Ambulatory Visit | Attending: Oncology | Admitting: Oncology

## 2024-12-26 DIAGNOSIS — C155 Malignant neoplasm of lower third of esophagus: Secondary | ICD-10-CM | POA: Diagnosis not present

## 2024-12-26 LAB — CBC WITH DIFFERENTIAL (CANCER CENTER ONLY)
Abs Immature Granulocytes: 0.28 K/uL — ABNORMAL HIGH (ref 0.00–0.07)
Basophils Absolute: 0.1 K/uL (ref 0.0–0.1)
Basophils Relative: 1 %
Eosinophils Absolute: 0.1 K/uL (ref 0.0–0.5)
Eosinophils Relative: 1 %
HCT: 33.7 % — ABNORMAL LOW (ref 39.0–52.0)
Hemoglobin: 10.5 g/dL — ABNORMAL LOW (ref 13.0–17.0)
Immature Granulocytes: 3 %
Lymphocytes Relative: 21 %
Lymphs Abs: 2.4 K/uL (ref 0.7–4.0)
MCH: 25.4 pg — ABNORMAL LOW (ref 26.0–34.0)
MCHC: 31.2 g/dL (ref 30.0–36.0)
MCV: 81.4 fL (ref 80.0–100.0)
Monocytes Absolute: 1.1 K/uL — ABNORMAL HIGH (ref 0.1–1.0)
Monocytes Relative: 10 %
Neutro Abs: 7.2 K/uL (ref 1.7–7.7)
Neutrophils Relative %: 64 %
Platelet Count: 326 K/uL (ref 150–400)
RBC: 4.14 MIL/uL — ABNORMAL LOW (ref 4.22–5.81)
RDW: 22.1 % — ABNORMAL HIGH (ref 11.5–15.5)
WBC Count: 11.2 K/uL — ABNORMAL HIGH (ref 4.0–10.5)
nRBC: 0 % (ref 0.0–0.2)

## 2024-12-26 LAB — CMP (CANCER CENTER ONLY)
ALT: 12 U/L (ref 0–44)
AST: 24 U/L (ref 15–41)
Albumin: 3.6 g/dL (ref 3.5–5.0)
Alkaline Phosphatase: 103 U/L (ref 38–126)
Anion gap: 9 (ref 5–15)
BUN: 9 mg/dL (ref 8–23)
CO2: 26 mmol/L (ref 22–32)
Calcium: 9.2 mg/dL (ref 8.9–10.3)
Chloride: 107 mmol/L (ref 98–111)
Creatinine: 0.82 mg/dL (ref 0.61–1.24)
GFR, Estimated: >60 mL/min
Glucose, Bld: 88 mg/dL (ref 70–99)
Potassium: 3.7 mmol/L (ref 3.5–5.1)
Sodium: 142 mmol/L (ref 135–145)
Total Bilirubin: 0.4 mg/dL (ref 0.0–1.2)
Total Protein: 5.8 g/dL — ABNORMAL LOW (ref 6.5–8.1)

## 2024-12-26 MED ORDER — FLUDEOXYGLUCOSE F - 18 (FDG) INJECTION
12.6300 | Freq: Once | INTRAVENOUS | Status: AC | PRN
  Administered 2024-12-26: 11.8 via INTRAVENOUS

## 2024-12-26 NOTE — Progress Notes (Unsigned)
 " [facility] at [facility] [address] [place],  [place]  [address] [phone]  Clinic Day: 12/05/2024  Referring physician: Jackolyn Darice BROCKS, FNP   HISTORY OF PRESENT ILLNESS:  The patient is a 69 y.o. 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  He comes in today to go over his PET scan to ascertain his new disease baseline after receiving 4 cycles of neoadjuvant FLOT/Durvalumab  chemotherapy.     He claims to have tolerated his 3rd cycle of treatment very well.  He continues to deny having any issue  with dysphagia.  He denies having any other symptoms/findings which concern him for disease progression.   PHYSICAL EXAM:  There were no vitals taken for this visit. Wt Readings from Last 3 Encounters:  12/05/24 261 lb (118.4 kg)  11/22/24 261 lb 12.8 oz (118.8 kg)  11/21/24 261 lb (118.4 kg)   There is no height or weight on file to calculate BMI. Performance status (ECOG): 1 - Symptomatic but completely ambulatory Physical Exam Constitutional:      Appearance: Normal appearance. He is obese. He is not ill-appearing.  HENT:     Mouth/Throat:     Mouth: Mucous membranes are moist.     Dentition: Abnormal dentition. Dental caries present.     Pharynx: Oropharynx is clear. No oropharyngeal exudate or posterior oropharyngeal erythema.  Cardiovascular:     Rate and Rhythm: Normal rate and regular rhythm.     Heart sounds: No murmur heard.    No friction rub. No gallop.  Pulmonary:     Effort: Pulmonary effort is normal. No respiratory distress.     Breath sounds: Normal breath sounds. No wheezing, rhonchi or rales.  Abdominal:     General: Bowel sounds are normal. There is no distension.     Palpations: Abdomen is soft. There is no mass.     Tenderness: There is no abdominal tenderness.  Musculoskeletal:        General: No swelling.     Right lower leg: No edema.     Left lower leg: No edema.  Lymphadenopathy:     Cervical: No cervical adenopathy.      Upper Body:     Right upper body: No supraclavicular or axillary adenopathy.     Left upper body: No supraclavicular or axillary adenopathy.     Lower Body: No right inguinal adenopathy. No left inguinal adenopathy.  Skin:    General: Skin is warm.     Coloration: Skin is not jaundiced.     Findings: No lesion or rash.  Neurological:     General: No focal deficit present.     Mental Status: He is alert and oriented to person, place, and time. Mental status is at baseline.  Psychiatric:        Mood and Affect: Mood normal.        Behavior: Behavior normal.        Thought Content: Thought content normal.   SCANS:  His PET scan on 12-26-24 revealed the following: FINDINGS: Mediastinal blood pool activity: SUV max 3.6   Liver activity: SUV max NA   NECK:   No abnormal hypermetabolism.   Incidental CT findings:   None.   CHEST:   Mild hypermetabolism in the distal esophagus, SUV max 5.9. Hypermetabolism in the medial anterior segment right upper lobe without a CT correlate. New subpleural ground-glass nodularity in the mid and lower lung zones. Largest individual nodular lesion measures 8 mm  in the posterior segment right upper lobe (6/25), SUV max 2.1.   Incidental CT findings:   Right IJ Port-A-Cath terminates in the right atrium. Atherosclerotic calcification of the aorta, aortic valve and coronary arteries. Heart is enlarged. No pericardial or pleural effusion.   ABDOMEN/PELVIS:   Hypermetabolic retrocrural lymph node measures 9 mm (3/100), SUV max 3.9. Inguinal lymph nodes do not show metabolism above blood pool. Hypermetabolic portacaval lymph node measures 9 mm (3/109), SUV max 4.2. No additional abnormal hypermetabolism.   Incidental CT findings:   Layering gallstones. Small low-attenuation lesions in the kidneys. No specific follow-up necessary. Moderate hiatal hernia. Gastric sleeve.   SKELETON:   No abnormal hypermetabolism.   Incidental CT  findings:   Degenerative changes in the spine.   IMPRESSION: 1. Hypermetabolic distal esophageal carcinoma with a metastatic retrocrural lymph node. 2. Hypermetabolic portacaval lymph node, indeterminate. Recommend attention on follow-up. 3. Minimal lower lung zone subpleural ground-glass nodularity is new and may be infectious/inflammatory etiology. 4. Cholelithiasis. 5. Moderate hiatal hernia with the gastric sleeve. 6. Aortic atherosclerosis (ICD10-I70.0). Coronary artery calcification.  LABS:      Latest Ref Rng & Units 12/26/2024    9:24 AM 12/05/2024    9:07 AM 11/21/2024    8:59 AM  CBC  WBC 4.0 - 10.5 K/uL 11.2  14.1  26.9   Hemoglobin 13.0 - 17.0 g/dL 89.4  89.3  88.9   Hematocrit 39.0 - 52.0 % 33.7  34.4  34.4   Platelets 150 - 400 K/uL 326  205  202       Latest Ref Rng & Units 12/26/2024    9:24 AM 12/05/2024    9:07 AM 11/21/2024    8:59 AM  CMP  Glucose 70 - 99 mg/dL 88  893  864   BUN 8 - 23 mg/dL 9  9  15    Creatinine 0.61 - 1.24 mg/dL 9.17  9.01  8.92   Sodium 135 - 145 mmol/L 142  144  139   Potassium 3.5 - 5.1 mmol/L 3.7  3.0  3.6   Chloride 98 - 111 mmol/L 107  110  106   CO2 22 - 32 mmol/L 26  23  24    Calcium  8.9 - 10.3 mg/dL 9.2  8.8  9.2   Total Protein 6.5 - 8.1 g/dL 5.8  6.0  6.2   Total Bilirubin 0.0 - 1.2 mg/dL 0.4  0.3  0.2   Alkaline Phos 38 - 126 U/L 103  136  148   AST 15 - 41 U/L 24  26  16    ALT 0 - 44 U/L 12  28  15     PATHOLOGY:   Esophageal mass, endoscopic biopsy: At least intramucosal carcinoma, poorly differentiated, 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    Electronically signed by Eva Suzann Joanie Vita, MD on 10/11/2024 at 1412 EDT  Comment   Paraffin embedded tumor tissue (block A1) was submitted for immunohistochemistry to exclude neuroendocrine and squamous differentiation and to investigate DNA mismatch repair protein expression with the following results:   Immunohistochemical stains   Block Stain Result  A1-2 CK5/6 Negative  A1-3 p63 Negative  A1-4 Synaptophysin  Negative  A1-5 CD56 Positive  A1-6 Chromogranin A  Negative  A1-17 MLH1 Retained  A1-18 MSH2 Retained    A1-19 MSH6 Retained    A1-20 PMS2 Retained      INTERPRETATION:  -Immunohistochemical evidence of neuroendocrine differentiation; not diagnostic of neuroendocrine carcinoma. -No defect  in DNA mismatch repair is detected, low probability of microsatellite instability-high (MSI-H). Internal controls are adequate.     Diagnosis   A.  CELIAC LYMPH NODE, CORE BIOPSY WITH TOUCH PREPS:              Malignant cells present, consistent with metastatic carcinoma.              See comment.    Electronically signed by Jeraldine Cohn, MD on 10/10/2024 at 1114 EDT  Comment   Immunostains with appropriate controls show that tumor cells are positive for pancytokeratin (AE1/3) and cytokeratin 7, and negative for cytokeratin 20, p40 and INSM-1.   Addendum 2   HER2 Biomarker Testing   Block Number: A1   Result:   HER2:                Positive  (Score 3+)     COMMENT: Fixation time was in compliance with CAP/ASCO guidelines.   The external immunohistochemical controls stained appropriately.     ASSESSMENT & PLAN:  A 69 y.o. 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  He will proceed with his fourth and final cycle of neoadjuvant FLOT/Durvalumab  today.  Clinically, the patient appears to be doing very well.  I will see this patient back in 3 weeks for repeat clinical assessment.  A PET scan will be done a day before his next visit to ascertain his new disease baseline after the completion of his neoadjuvant chemo/immunotherapy.  The patient understands all the plans discussed today and is in agreement with them.  Maxton Noreen DELENA Kerns, MD       "

## 2024-12-27 ENCOUNTER — Inpatient Hospital Stay: Admitting: Oncology

## 2024-12-27 ENCOUNTER — Other Ambulatory Visit: Payer: Self-pay | Admitting: Oncology

## 2024-12-27 VITALS — BP 147/64 | HR 73 | Temp 97.8°F | Resp 16 | Ht 69.0 in | Wt 268.4 lb

## 2024-12-27 DIAGNOSIS — C155 Malignant neoplasm of lower third of esophagus: Secondary | ICD-10-CM

## 2024-12-27 NOTE — Progress Notes (Unsigned)
 PATIENT C/O GROWTH ON INSIDE OF LEFT MOUTH THAT IS GETTING BIGGER

## 2024-12-28 ENCOUNTER — Encounter: Payer: Self-pay | Admitting: Oncology

## 2024-12-28 ENCOUNTER — Other Ambulatory Visit: Payer: Self-pay

## 2025-01-02 ENCOUNTER — Inpatient Hospital Stay

## 2025-01-02 ENCOUNTER — Inpatient Hospital Stay: Admitting: Oncology

## 2025-01-03 ENCOUNTER — Telehealth: Payer: Self-pay

## 2025-01-03 ENCOUNTER — Inpatient Hospital Stay

## 2025-01-03 NOTE — Telephone Encounter (Signed)
 Patient is inquiring on referrals since last office visit 12/28/2024 per note it looks like thoracic surgery and ENT.

## 2025-01-04 ENCOUNTER — Inpatient Hospital Stay

## 2025-01-12 ENCOUNTER — Telehealth: Payer: Self-pay

## 2025-01-12 NOTE — Telephone Encounter (Signed)
 Pt's wife LVM on nurse line that Lela has stomach cramps and nausea. She wants to know if there is anything else she can get to help him. No emesis since Sunday.

## 2025-01-20 ENCOUNTER — Encounter: Admitting: Thoracic Surgery (Cardiothoracic Vascular Surgery)

## 2025-02-24 ENCOUNTER — Inpatient Hospital Stay: Admitting: Oncology

## 2025-02-24 ENCOUNTER — Inpatient Hospital Stay
